# Patient Record
Sex: Female | Born: 1955 | Race: White | Hispanic: No | Marital: Married | Smoking: Former smoker
Health system: Southern US, Community
[De-identification: ages and names within clinical notes are randomized; demographics above are authoritative.]

## PROBLEM LIST (undated history)

## (undated) DIAGNOSIS — E785 Hyperlipidemia, unspecified: Secondary | ICD-10-CM

## (undated) DIAGNOSIS — M1990 Unspecified osteoarthritis, unspecified site: Secondary | ICD-10-CM

## (undated) DIAGNOSIS — M5126 Other intervertebral disc displacement, lumbar region: Secondary | ICD-10-CM

## (undated) HISTORY — PX: DILATION AND CURETTAGE OF UTERUS: SHX78

## (undated) HISTORY — DX: Hyperlipidemia, unspecified: E78.5

## (undated) HISTORY — PX: TONSILLECTOMY: SUR1361

---

## 2000-10-23 ENCOUNTER — Encounter: Admission: RE | Admit: 2000-10-23 | Discharge: 2000-10-23 | Payer: Self-pay | Admitting: Obstetrics and Gynecology

## 2000-10-23 ENCOUNTER — Encounter: Payer: Self-pay | Admitting: Obstetrics and Gynecology

## 2001-10-28 ENCOUNTER — Encounter: Payer: Self-pay | Admitting: Internal Medicine

## 2001-10-28 ENCOUNTER — Encounter: Admission: RE | Admit: 2001-10-28 | Discharge: 2001-10-28 | Payer: Self-pay | Admitting: Internal Medicine

## 2002-01-24 ENCOUNTER — Other Ambulatory Visit: Admission: RE | Admit: 2002-01-24 | Discharge: 2002-01-24 | Payer: Self-pay | Admitting: Obstetrics and Gynecology

## 2003-01-29 ENCOUNTER — Encounter: Admission: RE | Admit: 2003-01-29 | Discharge: 2003-01-29 | Payer: Self-pay | Admitting: Obstetrics and Gynecology

## 2003-01-29 ENCOUNTER — Encounter: Payer: Self-pay | Admitting: Obstetrics and Gynecology

## 2003-01-30 ENCOUNTER — Other Ambulatory Visit: Admission: RE | Admit: 2003-01-30 | Discharge: 2003-01-30 | Payer: Self-pay | Admitting: Obstetrics and Gynecology

## 2004-07-01 ENCOUNTER — Other Ambulatory Visit: Admission: RE | Admit: 2004-07-01 | Discharge: 2004-07-01 | Payer: Self-pay | Admitting: Obstetrics and Gynecology

## 2004-11-15 ENCOUNTER — Encounter: Admission: RE | Admit: 2004-11-15 | Discharge: 2004-11-15 | Payer: Self-pay | Admitting: Obstetrics and Gynecology

## 2005-10-12 ENCOUNTER — Other Ambulatory Visit: Admission: RE | Admit: 2005-10-12 | Discharge: 2005-10-12 | Payer: Self-pay | Admitting: Obstetrics and Gynecology

## 2006-08-17 ENCOUNTER — Encounter: Admission: RE | Admit: 2006-08-17 | Discharge: 2006-08-17 | Payer: Self-pay | Admitting: Obstetrics and Gynecology

## 2006-08-29 ENCOUNTER — Encounter: Admission: RE | Admit: 2006-08-29 | Discharge: 2006-08-29 | Payer: Self-pay | Admitting: Obstetrics and Gynecology

## 2007-10-24 ENCOUNTER — Encounter: Admission: RE | Admit: 2007-10-24 | Discharge: 2007-10-24 | Payer: Self-pay | Admitting: Obstetrics and Gynecology

## 2008-01-02 ENCOUNTER — Ambulatory Visit: Payer: Self-pay | Admitting: Internal Medicine

## 2008-01-02 LAB — CONVERTED CEMR LAB
ALT: 20 units/L (ref 0–35)
AST: 17 units/L (ref 0–37)
Albumin: 3.7 g/dL (ref 3.5–5.2)
BUN: 13 mg/dL (ref 6–23)
Basophils Absolute: 0 10*3/uL (ref 0.0–0.1)
Basophils Relative: 0.2 % (ref 0.0–1.0)
Bilirubin, Direct: 0.1 mg/dL (ref 0.0–0.3)
Chloride: 106 meq/L (ref 96–112)
Eosinophils Relative: 2.8 % (ref 0.0–5.0)
HDL: 42.4 mg/dL (ref >39.0)
Hemoglobin: 13.6 g/dL (ref 12.0–15.0)
Leukocytes, UA: NEGATIVE
Lymphocytes Relative: 24.1 % (ref 12.0–46.0)
MCV: 91.6 fL (ref 78.0–100.0)
Neutro Abs: 5.3 10*3/uL (ref 1.4–7.7)
Neutrophils Relative %: 68.2 % (ref 43.0–77.0)
Nitrite: NEGATIVE
Sodium: 143 meq/L (ref 135–145)
Total Bilirubin: 0.6 mg/dL (ref 0.3–1.2)
VLDL: 28 mg/dL (ref 0–40)
WBC: 7.8 10*3/uL (ref 4.5–10.5)
pH: 5.5 (ref 5.0–8.0)

## 2008-01-03 ENCOUNTER — Ambulatory Visit: Payer: Self-pay | Admitting: Internal Medicine

## 2008-01-03 DIAGNOSIS — E785 Hyperlipidemia, unspecified: Secondary | ICD-10-CM

## 2008-01-03 DIAGNOSIS — F17200 Nicotine dependence, unspecified, uncomplicated: Secondary | ICD-10-CM | POA: Insufficient documentation

## 2008-01-03 DIAGNOSIS — E663 Overweight: Secondary | ICD-10-CM | POA: Insufficient documentation

## 2010-08-26 ENCOUNTER — Encounter: Admission: RE | Admit: 2010-08-26 | Discharge: 2010-08-26 | Payer: Self-pay | Admitting: Obstetrics and Gynecology

## 2010-10-19 ENCOUNTER — Encounter: Payer: Self-pay | Admitting: Internal Medicine

## 2010-12-02 ENCOUNTER — Other Ambulatory Visit: Payer: BC Managed Care – PPO

## 2010-12-02 ENCOUNTER — Other Ambulatory Visit: Payer: Self-pay | Admitting: Internal Medicine

## 2010-12-02 ENCOUNTER — Encounter (INDEPENDENT_AMBULATORY_CARE_PROVIDER_SITE_OTHER): Payer: Self-pay | Admitting: *Deleted

## 2010-12-02 DIAGNOSIS — E785 Hyperlipidemia, unspecified: Secondary | ICD-10-CM

## 2010-12-02 DIAGNOSIS — Z0000 Encounter for general adult medical examination without abnormal findings: Secondary | ICD-10-CM

## 2010-12-02 LAB — BASIC METABOLIC PANEL
CO2: 29 mEq/L (ref 19–32)
Calcium: 9.4 mg/dL (ref 8.4–10.5)
Creatinine, Ser: 0.8 mg/dL (ref 0.4–1.2)
Potassium: 5.1 mEq/L (ref 3.5–5.1)

## 2010-12-02 LAB — URINALYSIS
Bilirubin Urine: NEGATIVE
Ketones, ur: NEGATIVE
Nitrite: NEGATIVE
Specific Gravity, Urine: 1.015 (ref 1.000–1.030)

## 2010-12-02 LAB — HEPATIC FUNCTION PANEL
Bilirubin, Direct: 0.1 mg/dL (ref 0.0–0.3)
Total Bilirubin: 0.5 mg/dL (ref 0.3–1.2)
Total Protein: 6.7 g/dL (ref 6.0–8.3)

## 2010-12-02 LAB — LIPID PANEL
Cholesterol: 359 mg/dL — ABNORMAL HIGH (ref 0–200)
HDL: 54.7 mg/dL (ref >39.00)
Triglycerides: 156 mg/dL — ABNORMAL HIGH (ref 0.0–149.0)
VLDL: 31.2 mg/dL (ref 0.0–40.0)

## 2010-12-02 LAB — CBC WITH DIFFERENTIAL/PLATELET
Basophils Absolute: 0 10*3/uL (ref 0.0–0.1)
Eosinophils Absolute: 0.2 10*3/uL (ref 0.0–0.7)
HCT: 40.1 % (ref 36.0–46.0)
MCV: 94.3 fl (ref 78.0–100.0)
Platelets: 208 10*3/uL (ref 150.0–400.0)
RDW: 13.4 % (ref 11.5–14.6)

## 2010-12-07 ENCOUNTER — Encounter (INDEPENDENT_AMBULATORY_CARE_PROVIDER_SITE_OTHER): Payer: BC Managed Care – PPO | Admitting: Internal Medicine

## 2010-12-07 ENCOUNTER — Other Ambulatory Visit: Payer: Self-pay | Admitting: Internal Medicine

## 2010-12-07 ENCOUNTER — Ambulatory Visit (INDEPENDENT_AMBULATORY_CARE_PROVIDER_SITE_OTHER)
Admission: RE | Admit: 2010-12-07 | Discharge: 2010-12-07 | Disposition: A | Discharge status: Home / Self Care | Payer: BC Managed Care – PPO | Source: Ambulatory Visit | Attending: Internal Medicine | Admitting: Internal Medicine

## 2010-12-07 ENCOUNTER — Ambulatory Visit
Admission: RE | Admit: 2010-12-07 | Discharge: 2010-12-07 | Disposition: A | Discharge status: Home / Self Care | Payer: BC Managed Care – PPO | Source: Ambulatory Visit | Attending: Internal Medicine | Admitting: Internal Medicine

## 2010-12-07 ENCOUNTER — Encounter: Payer: Self-pay | Admitting: Internal Medicine

## 2010-12-07 DIAGNOSIS — M1710 Unilateral primary osteoarthritis, unspecified knee: Secondary | ICD-10-CM | POA: Insufficient documentation

## 2010-12-07 DIAGNOSIS — Z136 Encounter for screening for cardiovascular disorders: Secondary | ICD-10-CM

## 2010-12-07 DIAGNOSIS — F17200 Nicotine dependence, unspecified, uncomplicated: Secondary | ICD-10-CM

## 2010-12-07 DIAGNOSIS — M25569 Pain in unspecified knee: Secondary | ICD-10-CM

## 2010-12-07 DIAGNOSIS — E785 Hyperlipidemia, unspecified: Secondary | ICD-10-CM

## 2010-12-07 DIAGNOSIS — Z0000 Encounter for general adult medical examination without abnormal findings: Secondary | ICD-10-CM

## 2010-12-08 ENCOUNTER — Encounter: Payer: Self-pay | Admitting: Internal Medicine

## 2010-12-13 NOTE — Assessment & Plan Note (Signed)
Summary: CPX  BCBS --SS   Vital Signs:  Patient profile:   55 year old female Height:      67 inches Weight:      207 pounds BMI:     32.54 O2 Sat:      97 % on Room air Temp:     98.0 degrees F oral Pulse rate:   90 / minute BP sitting:   118 / 82  (left arm) Cuff size:   regular  Vitals Entered By: Bill Salinas CMA (December 07, 2010 10:02 AM)  O2 Flow:  Room air CC: cpx/ ab  Vision Screening:      Vision Comments: Last eye exam Dec 2011 Normal exam   Primary Care Provider:  Creighton Longley  CC:  cpx/ ab.  History of Present Illness: Stephanie Blake presnets for follow-up medical exam. she was last seen in April '09. In the interval  no major medical illness, no surgery or injury. she is current with her gyne: Jan '12 - Pelvic/PAP; Nov 11 mammogram; had DXA ''11 and was strongly normal.   Her only complaint is knee pain. This has been a progressive problem and does limit her activities to some extent. 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 She has contemplated seeing orthopedics but has not yet made that move. She is taking glucosamine which has helped and she take prn aleve, no daily.   she has very high cholesterol She was on lipitor but stopped due to malaise and muscle pain which cleared when off meds for several weeks. Of course her cholesterol at [facility] was "off the chart" with LDL 283.   She does feel generally well and she and husband have fun. There is a new grand-daughter in [place] who she sees every month as a "doting" gandmother.  Preventive Screening-Counseling & Management  Alcohol-Tobacco     Alcohol drinks/day: 1     Alcohol type: wine     Alcohol Counseling: not indicated; use of alcohol is not excessive or problematic     Smoking Status: current     Smoke Cessation Stage: precontemplative     Packs/Day: o.1  Caffeine-Diet-Exercise     Caffeine use/day: 4 cups per day     Diet Comments: heart healthy low fat     Does Patient Exercise: yes     Type of  exercise: Yoga, pilates     Exercise (avg: min/session): 30-60     Times/week: 6  Hep-HIV-STD-Contraception     Hepatitis Risk: no risk noted     HIV Risk: no risk noted     STD Risk: no risk noted     Dental Visit-last 6 months yes     SBE monthly: yes     Sun Exposure-Excessive: no  Safety-Violence-Falls     Seat Belt Use: yes     Helmet Use: n/a     Firearms in the Home: no firearms in the home     Smoke Detectors: yes     Violence in the Home: no risk noted     Sexual Abuse: no     Fall Risk: low fall risk      Sexual History:  currently monogamous.        Drug Use:  never.        Blood Transfusions:  no.    Current Medications (verified): 1)  Lunesta 3 Mg  Tabs (Eszopiclone) .... Take 1 Tab By Mouth At Bedtime As Needed 2)  Multivitamins  Tabs (Multiple Vitamin) .... Take 1 Tablet By Mouth Once A Day 3)  Vitamin D3 1000 Unit Tabs (Cholecalciferol) .Marland Kitchen.. 1 Per Day 4)  Crestor 20 Mg Tabs (Rosuvastatin Calcium) .Marland Kitchen.. 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  Social History: Caffeine use/day:  4 cups per day Dental Care w/in 6 mos.:  yes Sun Exposure-Excessive:  no Seat Belt Use:  yes Fall Risk:  low fall risk Blood Transfusions:  no Packs/Day:  o.1 Hepatitis Risk:  no risk noted HIV Risk:  no risk noted STD Risk:  no risk noted Sexual History:  currently monogamous Drug Use:  never  Review of Systems       The patient complains of weight loss.  The patient denies anorexia, fever, weight  gain, vision loss, decreased hearing, chest pain, syncope, dyspnea on exertion, peripheral edema, headaches, abdominal pain, severe indigestion/heartburn, incontinence, suspicious skin lesions, depression, abnormal bleeding, and enlarged lymph nodes.         22 lbs off in 6 months intentional  Physical Exam  General:  Well-developed,well-nourished,in no acute distress; alert,appropriate and cooperative throughout examination Head:  Normocephalic and atraumatic without obvious abnormalities. No apparent alopecia or balding. Eyes:  vision grossly intact, pupils equal, pupils round, and corneas and lenses clear.   Ears:  cerumen impacted Nose:  no external deformity and no external erythema.   Mouth:  Oral mucosa and oropharynx without lesions or exudates.  Teeth in good repair. Neck:  supple, full ROM, no thyromegaly, and no carotid bruits.   Chest Wall:  no deformities and no tenderness.   Breasts:  deferred Lungs:  normal respiratory effort, normal breath sounds, no crackles, and no wheezes.   Heart:  normal rate, regular rhythm, and no murmur.   Abdomen:  soft, non-tender, normal bowel sounds, no guarding, and no rigidity.   Msk:  normal ROM, no joint tenderness, no joint swelling, and no joint deformities.   Pulses:  2+ radial, trace - DP and PT  pulses Neurologic:  alert & oriented X3, cranial nerves II-XII intact, strength normal in all extremities, sensation intact to light touch, gait normal, and DTRs symmetrical and normal.   Skin:  no rashes, no suspicious lesions, no ulcerations, and no edema.   Cervical Nodes:  no anterior cervical adenopathy and no posterior cervical adenopathy.   Psych:  Oriented X3, memory intact for recent and remote, good eye contact, and not anxious appearing.     Impression & Recommendations:  Problem # 1:  KNEE PAIN, BILATERAL (ICD-719.46) Progressive knee pain. She does not want to contemplate surgery but she does have discomfort. She is advised to  have knee films to assess the degree of the problem. She is informed that hyaluronidase injections can be very helpful in relieving pain and forestall knee surgery.  Plan - knee films  Orders: T-Knee Comp Left 4 Views [phone]) T-Knee Comp Right 4 Views [phone])  Addendum- x-ray reveals advanced DJD with left knee having bone-on-bone changes, right knee with loss of joint space and osteophytes and scant synovial fluid.  Problem # 2:  HYPERLIPIDEMIA (ICD-272.4) Remarkably elevated LDL. She did have relief of myalgias and cramps when she stopped lipitor.  Plan - trial of crestor at 3rd step dose of 20mg            follow-up lab in 3 weeks.           watch for recurrent myalgias.  The following medications were removed from the medication list:    Simvastatin 40 Mg Tabs (Simvastatin) .Marland Kitchen... 1 by mouth q pm Her updated medication list for this problem includes:    Crestor 20 Mg Tabs (Rosuvastatin calcium) .Marland Kitchen... 1 by mouth qpm  Problem # 3:  OVERWEIGHT (ICD-278.02) Very weight conscious. She has lost 22 lbs in 6 months and is congratulated. Touched on principles of weight management: smart food choices, portion size control and regular aerobic exercise.   Target weight, for BMI of 26, 170 lbs ( 37 lbs loss) with a goal of loosing 1 lb per month.  Problem # 4:  TOBACCO ABUSE (ICD-305.1) She continues to smoke, usually when "out with the girls." A pack of cigarettes last 7-10 days. We discussed smoking cessation but she is really precontemplative.   Problem # 5:  Preventive Health Care (ICD-V70.0) Interval history significant for progressive knee pain otherwise benign. Exam is normal. She is current with gyn follow-up. She is current with breast cancer screening. She is willing to have colonoscopy for colorectal cancer screening and will be referred. Labs except for cholesterol levels are within normal limits. She is taking on weight management. Immunizations: Tetnus March '08; current with  influenze; not willing to do pneumonia vaccine. 12 lead EKG is negative for injury or ischemia.  In summary - a delightful woman who appears to be in good health and medically stable except for lipid managment and progressive/advanced DJD both knees. She is encouraged to QUIT SMOKING! She will return for follow-up lab and colonoscopy otherwise as needed or in 1 year.   Complete Medication List: 1)  Lunesta 3 Mg Tabs (Eszopiclone) .... Take 1 tab by mouth at bedtime as needed 2)  Multivitamins Tabs (Multiple vitamin) .... Take 1 tablet by mouth once a day 3)  Vitamin D3 1000 Unit Tabs (Cholecalciferol) .Marland Kitchen.. 1 per day 4)  Crestor 20 Mg Tabs (Rosuvastatin calcium) .Marland Kitchen.. 1 by mouth qpm  Other Orders: EKG w/ Interpretation (93000) Tobacco use cessation intermediate 3-10 minutes ([address])  Patient: Stephanie Blake Note: All result statuses are Final unless otherwise noted.  Tests: (1) CBC Platelet w/Diff (CBCD)   White Cell Count          6.4 K/uL                    4.5-10.5   Red Cell Count            4.25 Mil/uL                 3.87-5.11   Hemoglobin                13.8 g/dL                   60.4-54.0   Hematocrit                40.1 %                      36.0-46.0   MCV  94.3 fl                     78.0-100.0   MCHC                      34.3 g/dL                   16.1-09.6   RDW                       13.4 %                      11.5-14.6   Platelet Count            208.0 K/uL                  150.0-400.0   Neutrophil %              65.2 %                      43.0-77.0   Lymphocyte %              26.5 %                      12.0-46.0   Monocyte %                5.2 %                       3.0-12.0   Eosinophils%              3.0 %                       0.0-5.0   Basophils %               0.1 %                       0.0-3.0   Neutrophill Absolute      4.2 K/uL                    1.4-7.7   Lymphocyte Absolute       1.7 K/uL                    0.7-4.0   Monocyte Absolute          0.3 K/uL                    0.1-1.0  Eosinophils, Absolute                             0.2 K/uL                    0.0-0.7   Basophils Absolute        0.0 K/uL                    0.0-0.1  Tests: (2) Lipid Panel (LIPID)   Cholesterol          [H]  359 mg/dL                   0-454     ATP III Classification  Desirable:  < 200 mg/dL                    Borderline High:  200 - 239 mg/dL               High:  > = 240 mg/dL   Triglycerides        [H]  156.0 mg/dL                 1.6-109.6     Normal:  <150 mg/dL     Borderline High:  045 - 199 mg/dL   HDL                       40.98 mg/dL                 >11.91   VLDL Cholesterol          31.2 mg/dL                  4.7-82.9  CHO/HDL Ratio:  CHD Risk                             7                    Men          Women     1/2 Average Risk     3.4          3.3     Average Risk          5.0          4.4     2X Average Risk          9.6          7.1     3X Average Risk          15.0          11.0                           Tests: (3) BMP (METABOL)   Sodium                    141 mEq/L                   135-145   Potassium                 5.1 mEq/L                   3.5-5.1   Chloride                  106 mEq/L                   96-112   Carbon Dioxide            29 mEq/L                    19-32   Glucose                   89 mg/dL                    56-21   BUN  14 mg/dL                    1-61   Creatinine                0.8 mg/dL                   0.9-6.0   Calcium                   9.4 mg/dL                   4.5-40.9   GFR                       82.16 mL/min                >60.00  Tests: (4) Hepatic/Liver Function Panel (HEPATIC)   Total Bilirubin           0.5 mg/dL                   8.1-1.9   Direct Bilirubin          0.1 mg/dL                   1.4-7.8   Alkaline Phosphatase      47 U/L                      39-117   AST                       17 U/L                      0-37   ALT                        20 U/L                      0-35   Total Protein             6.7 g/dL                    2.9-5.6   Albumin                   4.1 g/dL                    2.1-3.0  Tests: (5) TSH (TSH)   FastTSH                   1.11 uIU/mL                 0.35-5.50  Tests: (6) UDip Only (UDIP)   Color                     Yellow       RANGE:  Yellow;Lt. Yellow   Clarity                   CLEAR                       Clear   Specific Gravity          1.015  1.000 - 1.030   Urine Ph                  7.0                         5.0-8.0   Protein                   NEGATIVE                    Negative   Urine Glucose             NEGATIVE                    Negative   Ketones                   NEGATIVE                    Negative   Urine Bilirubin           NEGATIVE                    Negative   Blood                     NEGATIVE                    Negative   Urobilinogen              0.2                         0.0 - 1.0   Leukocyte Esterace        NEGATIVE                    Negative   Nitrite                   NEGATIVE                    Negative  Tests: (7) Cholesterol LDL - Direct (DIRLDL)  Cholesterol LDL - Direct                             283.2 mg/dL     Optimal:  <161 mg/dL     Near or Above Optimal:  100-129 mg/dL     Borderline High:  096-045 mg/dL     High:  409-811 mg/dL     Very High:  >914 mg/dL  DG KNEE COMPLETE 4 VIEWS RIGHT - [id number] Clinical Data: Chronic bilateral knee pain.  RIGHT KNEE - COMPLETE 4+ VIEW  Comparison: None.  Findings: No acute bony or joint abnormality is identified.  The patient has degenerative disease about the knee asymmetrically worst in the medial compartment where there is marked joint space narrowing and prominent osteophytes.  Scant amount of joint fluid is noted.  IMPRESSION:  1.  No acute finding. 2.  Degenerative disease asymmetrically worst in the medial compartment where it appears advanced.  DG KNEE  COMPLETE 4 VIEWS LEFT - [id number] Clinical Data: Chronic bilateral knee pain.  LEFT KNEE - COMPLETE 4+ VIEW  Comparison: None.  Findings: No acute bony or joint abnormality is identified.  The patient has degenerative disease about the knee most notable in the medial compartment where there is  near bone-on-bone joint space narrowing.  Osteophytosis also is most prominent medially.  Scant amount of joint fluid is noted.  IMPRESSION: No acute finding.  Degenerative disease asymmetrically worst about the medial compartment.Prescriptions: CRESTOR 20 MG TABS (ROSUVASTATIN CALCIUM) 1 by mouth qPM  #30 x 6   Entered and Authorized by:   Jacques Navy MD   Signed by:   Jacques Navy MD on 12/07/2010   Method used:   Electronically to        [id number] (retail)       [address]       [place], [place]  [address]       Ph: [phone]       Fax: [phone]   RxID:   [id number]    Orders Added: 1)  T-Knee Comp Left 4 Views [73564TC] 2)  T-Knee Comp Right 4 Views [73564TC] 3)  Est. Patient 40-64 years [99396] 4)  Est. Patient Level III [84132] 5)  EKG w/ Interpretation [93000] 6)  Tobacco use cessation intermediate 3-10 minutes [99406]   Immunization History:  Tetanus/Td Immunization History:    Tetanus/Td:  tdap (12/08/2006)  Influenza Immunization History:    Influenza:  historical (07/04/2010)   Immunization History:  Tetanus/Td Immunization History:    Tetanus/Td:  Tdap (12/08/2006)  Influenza Immunization History:    Influenza:  Historical (07/04/2010)    Preventive Care Screening  Pap Smear:    Date:  10/19/2010    Results:  normal   Mammogram:    Date:  08/17/2010    Results:  normal   Last Tetanus Booster:    Date:  12/08/2006    Results:  Tdap

## 2011-01-02 ENCOUNTER — Other Ambulatory Visit: Payer: BC Managed Care – PPO

## 2011-01-03 ENCOUNTER — Other Ambulatory Visit (INDEPENDENT_AMBULATORY_CARE_PROVIDER_SITE_OTHER): Payer: BC Managed Care – PPO

## 2011-01-03 DIAGNOSIS — T887XXA Unspecified adverse effect of drug or medicament, initial encounter: Secondary | ICD-10-CM

## 2011-01-03 DIAGNOSIS — E785 Hyperlipidemia, unspecified: Secondary | ICD-10-CM

## 2011-01-03 LAB — HEPATIC FUNCTION PANEL
Albumin: 3.9 g/dL (ref 3.5–5.2)
Total Protein: 6.5 g/dL (ref 6.0–8.3)

## 2011-01-03 LAB — LIPID PANEL
Cholesterol: 203 mg/dL — ABNORMAL HIGH (ref 0–200)
Total CHOL/HDL Ratio: 4
Triglycerides: 106 mg/dL (ref 0.0–149.0)

## 2011-01-04 LAB — LDL CHOLESTEROL, DIRECT: Direct LDL: 129.6 mg/dL

## 2011-01-05 ENCOUNTER — Encounter: Payer: Self-pay | Admitting: Internal Medicine

## 2011-08-08 ENCOUNTER — Other Ambulatory Visit: Payer: Self-pay | Admitting: Internal Medicine

## 2011-10-11 ENCOUNTER — Telehealth: Payer: Self-pay

## 2011-10-11 MED ORDER — ROSUVASTATIN CALCIUM 20 MG PO TABS: ORAL_TABLET | ORAL | Status: DC

## 2011-10-11 NOTE — Telephone Encounter (Signed)
refill 

## 2012-10-10 LAB — HM PAP SMEAR: HM PAP: NORMAL

## 2012-12-05 ENCOUNTER — Other Ambulatory Visit: Payer: Self-pay

## 2012-12-05 MED ORDER — ROSUVASTATIN CALCIUM 20 MG PO TABS: ORAL_TABLET | ORAL | Status: DC

## 2012-12-25 ENCOUNTER — Ambulatory Visit (HOSPITAL_COMMUNITY)
Admission: RE | Admit: 2012-12-25 | Discharge: 2012-12-25 | Disposition: A | Discharge status: Home / Self Care | Payer: BC Managed Care – PPO | Source: Ambulatory Visit | Attending: Chiropractic Medicine | Admitting: Chiropractic Medicine

## 2012-12-25 ENCOUNTER — Other Ambulatory Visit (HOSPITAL_COMMUNITY): Payer: Self-pay | Admitting: Chiropractic Medicine

## 2012-12-25 DIAGNOSIS — M549 Dorsalgia, unspecified: Secondary | ICD-10-CM

## 2013-02-05 ENCOUNTER — Other Ambulatory Visit: Payer: Self-pay

## 2013-02-05 DIAGNOSIS — Z1231 Encounter for screening mammogram for malignant neoplasm of breast: Secondary | ICD-10-CM

## 2013-03-07 ENCOUNTER — Other Ambulatory Visit: Payer: Self-pay | Admitting: Internal Medicine

## 2013-03-07 LAB — HM MAMMOGRAPHY: HM Mammogram: NORMAL

## 2013-03-17 ENCOUNTER — Ambulatory Visit
Admission: RE | Admit: 2013-03-17 | Discharge: 2013-03-17 | Disposition: A | Discharge status: Home / Self Care | Payer: BC Managed Care – PPO | Source: Ambulatory Visit

## 2013-03-17 DIAGNOSIS — Z1231 Encounter for screening mammogram for malignant neoplasm of breast: Secondary | ICD-10-CM

## 2013-05-06 ENCOUNTER — Other Ambulatory Visit: Payer: Self-pay | Admitting: Internal Medicine

## 2013-06-05 ENCOUNTER — Other Ambulatory Visit: Payer: Self-pay | Admitting: Internal Medicine

## 2013-11-12 ENCOUNTER — Encounter: Payer: Self-pay | Admitting: Internal Medicine

## 2013-11-12 ENCOUNTER — Ambulatory Visit (INDEPENDENT_AMBULATORY_CARE_PROVIDER_SITE_OTHER): Payer: BC Managed Care – PPO | Admitting: Internal Medicine

## 2013-11-12 VITALS — BP 124/82 | HR 100 | Temp 98.6°F | Resp 16 | Ht 67.0 in | Wt 211.2 lb

## 2013-11-12 DIAGNOSIS — Z0000 Encounter for general adult medical examination without abnormal findings: Secondary | ICD-10-CM | POA: Insufficient documentation

## 2013-11-12 NOTE — Progress Notes (Signed)
   Subjective:    Patient ID: Stephanie Blake, female    DOB: Aug 27, 1956, 58 y.o.   MRN: [id number]  HPI Comments: New to me she comes in for a physical - she feels well and offers no complaints.     Review of Systems  All other systems reviewed and are negative.       Objective:   Physical Exam  Vitals reviewed. Constitutional: She is oriented to person, place, and time. She appears well-developed and well-nourished. No distress.  HENT:  Head: Normocephalic and atraumatic.  Mouth/Throat: Oropharynx is clear and moist. No oropharyngeal exudate.  Eyes: Conjunctivae are normal. Right eye exhibits no discharge. Left eye exhibits no discharge. No scleral icterus.  Neck: Normal range of motion. Neck supple. No JVD present. No tracheal deviation present. No thyromegaly present.  Cardiovascular: Normal rate, regular rhythm, normal heart sounds and intact distal pulses.  Exam reveals no gallop and no friction rub.   No murmur heard. Pulmonary/Chest: Effort normal and breath sounds normal. No stridor. No respiratory distress. She has no wheezes. She has no rales. She exhibits no tenderness.  Abdominal: Soft. Bowel sounds are normal. She exhibits no distension and no mass. There is no tenderness. There is no rebound and no guarding.  Musculoskeletal: Normal range of motion. She exhibits no edema and no tenderness.  Lymphadenopathy:    She has no cervical adenopathy.  Neurological: She is oriented to person, place, and time.  Skin: Skin is warm and dry. No rash noted. She is not diaphoretic. No erythema. No pallor.  Psychiatric: She has a normal mood and affect. Her behavior is normal. Judgment and thought content normal.     Lab Results  Component Value Date   WBC 6.4 12/02/2010   HGB 13.8 12/02/2010   HCT 40.1 12/02/2010   PLT 208.0 12/02/2010   GLUCOSE 89 12/02/2010   CHOL 203* 01/03/2011   TRIG 106.0 01/03/2011   HDL 57.20 01/03/2011   LDLDIRECT 129.6 01/03/2011   ALT 22 01/03/2011   AST 17  01/03/2011   NA 141 12/02/2010   K 5.1 12/02/2010   CL 106 12/02/2010   CREATININE 0.8 12/02/2010   BUN 14 12/02/2010   CO2 29 12/02/2010   TSH 1.11 12/02/2010       Assessment & Plan:

## 2013-11-12 NOTE — Patient Instructions (Signed)

## 2013-11-12 NOTE — Assessment & Plan Note (Signed)
She is not willing to do a colonoscopy so I have asked her to get a cologuard test done Exam done Labs ordered Vaccines were reviewed Pt ed material

## 2014-02-18 NOTE — Progress Notes (Signed)
Arlee Muslim, PA  -  Please enter preop orders in Epic for [employer] - surg date 6/8.  Thanks.

## 2014-02-23 ENCOUNTER — Other Ambulatory Visit: Payer: Self-pay | Admitting: Orthopedic Surgery

## 2014-03-02 ENCOUNTER — Encounter (HOSPITAL_COMMUNITY): Payer: Self-pay

## 2014-03-02 ENCOUNTER — Encounter (HOSPITAL_COMMUNITY): Payer: Self-pay | Admitting: Pharmacy Technician

## 2014-03-02 ENCOUNTER — Encounter (HOSPITAL_COMMUNITY)
Admission: RE | Admit: 2014-03-02 | Discharge: 2014-03-02 | Disposition: A | Discharge status: Home / Self Care | Payer: BC Managed Care – PPO | Source: Ambulatory Visit | Attending: Orthopedic Surgery | Admitting: Orthopedic Surgery

## 2014-03-02 DIAGNOSIS — Z01812 Encounter for preprocedural laboratory examination: Secondary | ICD-10-CM | POA: Insufficient documentation

## 2014-03-02 HISTORY — DX: Unspecified osteoarthritis, unspecified site: M19.90

## 2014-03-02 LAB — URINALYSIS, ROUTINE W REFLEX MICROSCOPIC
BILIRUBIN URINE: NEGATIVE
Glucose, UA: NEGATIVE mg/dL
Hgb urine dipstick: NEGATIVE
Ketones, ur: NEGATIVE mg/dL
LEUKOCYTES UA: NEGATIVE
NITRITE: NEGATIVE
Protein, ur: NEGATIVE mg/dL
Specific Gravity, Urine: 1.007 (ref 1.005–1.030)
UROBILINOGEN UA: 0.2 mg/dL (ref 0.0–1.0)
pH: 7.5 (ref 5.0–8.0)

## 2014-03-02 LAB — COMPREHENSIVE METABOLIC PANEL
ALT: 17 U/L (ref 0–35)
AST: 17 U/L (ref 0–37)
Albumin: 3.9 g/dL (ref 3.5–5.2)
Alkaline Phosphatase: 55 U/L (ref 39–117)
BILIRUBIN TOTAL: 0.3 mg/dL (ref 0.3–1.2)
BUN: 12 mg/dL (ref 6–23)
CHLORIDE: 105 meq/L (ref 96–112)
CO2: 27 mEq/L (ref 19–32)
Calcium: 9.1 mg/dL (ref 8.4–10.5)
Creatinine, Ser: 0.66 mg/dL (ref 0.50–1.10)
GFR calc Af Amer: >90 mL/min (ref >90)
GFR calc non Af Amer: >90 mL/min (ref >90)
Glucose, Bld: 89 mg/dL (ref 70–99)
Potassium: 4.8 mEq/L (ref 3.7–5.3)
Sodium: 142 mEq/L (ref 137–147)
TOTAL PROTEIN: 7.1 g/dL (ref 6.0–8.3)

## 2014-03-02 LAB — CBC
HEMATOCRIT: 40.2 % (ref 36.0–46.0)
Hemoglobin: 13.6 g/dL (ref 12.0–15.0)
MCH: 31.2 pg (ref 26.0–34.0)
MCHC: 33.8 g/dL (ref 30.0–36.0)
MCV: 92.2 fL (ref 78.0–100.0)
Platelets: 240 10*3/uL (ref 150–400)
RBC: 4.36 MIL/uL (ref 3.87–5.11)
RDW: 13.9 % (ref 11.5–15.5)
WBC: 5.7 10*3/uL (ref 4.0–10.5)

## 2014-03-02 LAB — PROTIME-INR
INR: 0.91 (ref 0.00–1.49)
PROTHROMBIN TIME: 12.1 s (ref 11.6–15.2)

## 2014-03-02 LAB — SURGICAL PCR SCREEN
MRSA, PCR: NEGATIVE
STAPHYLOCOCCUS AUREUS: NEGATIVE

## 2014-03-02 LAB — APTT: aPTT: 28 seconds (ref 24–37)

## 2014-03-02 NOTE — Patient Instructions (Addendum)
[place]  03/02/2014   Your procedure is scheduled on:   03-09-2014  Enter through [facility] Entrance and follow signs to [place]. Arrive at  0515      AM.  Call this number if you have problems the morning of surgery: [phone]  Or Presurgical Testing [phone](Nahlia Hellmann) For Living Will and/or Health Care Power Attorney Forms: please provide copy for your medical record,may bring AM of surgery(Forms should be already notarized -we do not provide this service).(Yes, will bring document AM of surgery).    Do not eat food:After Midnight.    Take these medicines the morning of surgery with A SIP OF WATER: Tylenol   Do not wear jewelry, make-up or nail polish.  Do not wear lotions, powders, or perfumes. You may wear deodorant.  Do not shave 48 hours(2 days) prior to first CHG shower(legs and under arms).(Shaving face and neck okay.)  Do not bring valuables to the hospital.(Hospital is not responsible for lost valuables).  Contacts, dentures or removable bridgework, body piercing, hair pins may not be worn into surgery.  Leave suitcase in the car. After surgery it may be brought to your room.  For patients admitted to the hospital, checkout time is 11:00 AM the day of discharge.(Restricted visitors-Any Persons displaying flu-like symptoms or illness).    Patients discharged the day of surgery will not be allowed to drive home. Must have responsible person with you x 24 hours once discharged.  Name and phone number of your driver: Winnona Wargo [phone] cell  Special Instructions: CHG(Chlorhedine 4%-"Hibiclens","Betasept","Aplicare") Shower Use Special Wash: see special instructions.(avoid face and genitals)   Please read over the following fact sheets that you were given: MRSA Information, Blood Transfusion fact sheet, Incentive Spirometry Instruction.  Remember : Type/Screen "Blue armbands" - may not be removed once applied(would result in being retested  AM of surgery, if removed).  Failure to follow these instructions may result in Cancellation of your surgery.       [place] - Preparing for Surgery Before surgery, you can play an important role.  Because skin is not sterile, your skin needs to be as free of germs as possible.  You can reduce the number of germs on your skin by washing with CHG (chlorahexidine gluconate) soap before surgery.  CHG is an antiseptic cleaner which kills germs and bonds with the skin to continue killing germs even after washing. Please DO NOT use if you have an allergy to CHG or antibacterial soaps.  If your skin becomes reddened/irritated stop using the CHG and inform your nurse when you arrive at Short Stay. Do not shave (including legs and underarms) for at least 48 hours prior to the first CHG shower.  You may shave your face/neck. Please follow these instructions carefully:  1.  Shower with CHG Soap the night before surgery and the  morning of Surgery.  2.  If you choose to wash your hair, wash your hair first as usual with your  normal  shampoo.  3.  After you shampoo, rinse your hair and body thoroughly to remove the  shampoo.                           4.  Use CHG as you would any other liquid soap.  You can apply chg directly  to the skin and wash  Gently with a scrungie or clean washcloth.  5.  Apply the CHG Soap to your body ONLY FROM THE NECK DOWN.   Do not use on face/ open                           Wound or open sores. Avoid contact with eyes, ears mouth and genitals (private parts).                       Wash face,  Genitals (private parts) with your normal soap.             6.  Wash thoroughly, paying special attention to the area where your surgery  will be performed.  7.  Thoroughly rinse your body with warm water from the neck down.  8.  DO NOT shower/wash with your normal soap after using and rinsing off  the CHG Soap.                9.  Pat yourself dry with a clean  towel.            10.  Wear clean pajamas.            11.  Place clean sheets on your bed the night of your first shower and do not  sleep with pets. Day of Surgery : Do not apply any lotions/deodorants the morning of surgery.  Please wear clean clothes to the hospital/surgery center.  FAILURE TO FOLLOW THESE INSTRUCTIONS MAY RESULT IN THE CANCELLATION OF YOUR SURGERY PATIENT SIGNATURE_________________________________  NURSE SIGNATURE__________________________________  ________________________________________________________________________   Adam Phenix  An incentive spirometer is a tool that can help keep your lungs clear and active. This tool measures how well you are filling your lungs with each breath. Taking [place] deep breaths may help reverse or decrease the chance of developing breathing (pulmonary) problems (especially infection) following:  A [place] period of time when you are unable to move or be active. BEFORE THE PROCEDURE   If the spirometer includes an indicator to show your best effort, your nurse or respiratory therapist will set it to a desired goal.  If possible, sit up straight or lean slightly forward. Try not to slouch.  Hold the incentive spirometer in an upright position. INSTRUCTIONS FOR USE  1. Sit on the edge of your bed if possible, or sit up as far as you can in bed or on a chair. 2. Hold the incentive spirometer in an upright position. 3. Breathe out normally. 4. Place the mouthpiece in your mouth and seal your lips tightly around it. 5. Breathe in slowly and as deeply as possible, raising the piston or the ball toward the top of the column. 6. Hold your breath for 3-5 seconds or for as [place] as possible. Allow the piston or ball to fall to the bottom of the column. 7. Remove the mouthpiece from your mouth and breathe out normally. 8. Rest for a few seconds and repeat Steps 1 through 7 at least 10 times every 1-2 hours when you are awake. Take your  time and take a few normal breaths between deep breaths. 9. The spirometer may include an indicator to show your best effort. Use the indicator as a goal to work toward during each repetition. 10. After each set of 10 deep breaths, practice coughing to be sure your lungs are clear. If you have an incision (the cut made at the time of  surgery), support your incision when coughing by placing a pillow or rolled up towels firmly against it. Once you are able to get out of bed, walk around indoors and cough well. You may stop using the incentive spirometer when instructed by your caregiver.  RISKS AND COMPLICATIONS  Take your time so you do not get dizzy or light-headed.  If you are in pain, you may need to take or ask for pain medication before doing incentive spirometry. It is harder to take a deep breath if you are having pain. AFTER USE  Rest and breathe slowly and easily.  It can be helpful to keep track of a log of your progress. Your caregiver can provide you with a simple table to help with this. If you are using the spirometer at home, follow these instructions: [place] IF:   You are having difficultly using the spirometer.  You have trouble using the spirometer as often as instructed.  Your pain medication is not giving enough relief while using the spirometer.  You develop fever of 100.5 F (38.1 C) or higher. SEEK IMMEDIATE MEDICAL CARE IF:   You cough up bloody sputum that had not been present before.  You develop fever of 102 F (38.9 C) or greater.  You develop worsening pain at or near the incision site. MAKE SURE YOU:   Understand these instructions.  Will watch your condition.  Will get help right away if you are not doing well or get worse. Document Released: 01/29/2007 Document Revised: 12/11/2011 Document Reviewed: 04/01/2007 ExitCare Patient Information 2014 ExitCare,  [place].   ________________________________________________________________________  WHAT IS A BLOOD TRANSFUSION? Blood Transfusion Information  A transfusion is the replacement of blood or some of its parts. Blood is made up of multiple cells which provide different functions.  Red blood cells carry oxygen and are used for blood loss replacement.  White blood cells fight against infection.  Platelets control bleeding.  Plasma helps clot blood.  Other blood products are available for specialized needs, such as hemophilia or other clotting disorders. BEFORE THE TRANSFUSION  Who gives blood for transfusions?   Healthy volunteers who are fully evaluated to make sure their blood is safe. This is blood bank blood. Transfusion therapy is the safest it has ever been in the practice of medicine. Before blood is taken from a donor, a complete history is taken to make sure that person has no history of diseases nor engages in risky social behavior (examples are intravenous drug use or sexual activity with multiple partners). The donor's travel history is screened to minimize risk of transmitting infections, such as malaria. The donated blood is tested for signs of infectious diseases, such as HIV and hepatitis. The blood is then tested to be sure it is compatible with you in order to minimize the chance of a transfusion reaction. If you or a relative donates blood, this is often done in anticipation of surgery and is not appropriate for emergency situations. It takes many days to process the donated blood. RISKS AND COMPLICATIONS Although transfusion therapy is very safe and saves many lives, the main dangers of transfusion include:   Getting an infectious disease.  Developing a transfusion reaction. This is an allergic reaction to something in the blood you were given. Every precaution is taken to prevent this. The decision to have a blood transfusion has been considered carefully by your caregiver  before blood is given. Blood is not given unless the benefits outweigh the risks. AFTER THE  TRANSFUSION  Right after receiving a blood transfusion, you will usually feel much better and more energetic. This is especially true if your red blood cells have gotten low (anemic). The transfusion raises the level of the red blood cells which carry oxygen, and this usually causes an energy increase.  The nurse administering the transfusion will monitor you carefully for complications. HOME CARE INSTRUCTIONS  No special instructions are needed after a transfusion. You may find your energy is better. Speak with your caregiver about any limitations on activity for underlying diseases you may have. SEEK MEDICAL CARE IF:   Your condition is not improving after your transfusion.  You develop redness or irritation at the intravenous (IV) site. SEEK IMMEDIATE MEDICAL CARE IF:  Any of the following symptoms occur over the next 12 hours:  Shaking chills.  You have a temperature by mouth above 102 F (38.9 C), not controlled by medicine.  Chest, back, or muscle pain.  People around you feel you are not acting correctly or are confused.  Shortness of breath or difficulty breathing.  Dizziness and fainting.  You get a rash or develop hives.  You have a decrease in urine output.  Your urine turns a dark color or changes to pink, red, or brown. Any of the following symptoms occur over the next 10 days:  You have a temperature by mouth above 102 F (38.9 C), not controlled by medicine.  Shortness of breath.  Weakness after normal activity.  The white part of the eye turns yellow (jaundice).  You have a decrease in the amount of urine or are urinating less often.  Your urine turns a dark color or changes to pink, red, or brown. Document Released: 09/15/2000 Document Revised: 12/11/2011 Document Reviewed: 05/04/2008 [facility] Patient Information 2014 [place],  [place].  _______________________________________________________________________

## 2014-03-03 ENCOUNTER — Other Ambulatory Visit: Payer: Self-pay | Admitting: Surgical

## 2014-03-03 NOTE — H&P (Signed)
TOTAL KNEE ADMISSION H&P  Patient is being admitted for right total knee arthroplasty.  Subjective:  Chief Complaint:right knee pain.  HPI: Stephanie Blake, 58 y.o. female, has a history of pain and functional disability in the right knee due to arthritis and has failed non-surgical conservative treatments for greater than 12 weeks to includeNSAID's and/or analgesics, corticosteriod injections, viscosupplementation injections, flexibility and strengthening excercises and activity modification.  Onset of symptoms was gradual, starting 5 years ago with gradually worsening course since that time. The patient noted no past surgery on the right knee(s).  Patient currently rates pain in the right knee(s) at 7 out of 10 with activity. Patient has night pain, worsening of pain with activity and weight bearing, pain that interferes with activities of daily living, pain with passive range of motion, crepitus and joint swelling.  Patient has evidence of periarticular osteophytes and joint space narrowing by imaging studies. There is no active infection.  Patient Active Problem List   Diagnosis Date Noted  . Routine general medical examination at a health care facility 11/12/2013  . DJD (degenerative joint disease) of knee 12/07/2010  . HYPERLIPIDEMIA 01/03/2008  . Overweight 01/03/2008   Past Medical History  Diagnosis Date  . Hyperlipidemia   . Arthritis     osteoarthritis- knees    Past Surgical History  Procedure Laterality Date  . Dilation and curettage of uterus      s/p miscarriage-30 yrs ago  . Tonsillectomy      childhood     Current outpatient prescriptions: Eszopiclone (ESZOPICLONE) 3 MG TABS, Take 3 mg by mouth at bedtime. Take immediately before bedtime, Disp: , Rfl: ;   glucosamine-chondroitin 500-400 MG tablet, Take 1 tablet by mouth 3 (three) times daily., Disp: , Rfl: ;   naproxen sodium (ANAPROX) 220 MG tablet, Take 220 mg by mouth 2 (two) times daily with a meal., Disp: , Rfl:    Allergies  Allergen Reactions  . Crestor [Rosuvastatin]     Muscle aches    History  Substance Use Topics  . Smoking status: Former Research scientist (life sciences)  . Smokeless tobacco: Former Systems developer    Quit date: 11/02/2013  . Alcohol Use: Yes     Comment: wine occ. rare    Family History  Problem Relation Age of Onset  . Breast cancer Mother   . Alcohol abuse Father   . Cancer Neg Hx   . Diabetes Neg Hx   . Early death Neg Hx   . Heart disease Neg Hx   . Hyperlipidemia Neg Hx   . Stroke Neg Hx      Review of Systems  Constitutional: Negative.   HENT: Negative.   Eyes: Negative.   Respiratory: Negative.   Cardiovascular: Negative.   Gastrointestinal: Negative.   Genitourinary: Negative.   Musculoskeletal: Positive for joint pain and myalgias. Negative for back pain, falls and neck pain.       Bilateral knee pain  Skin: Negative.   Neurological: Negative.   Endo/Heme/Allergies: Negative.   Psychiatric/Behavioral: Negative.     Objective:  Physical Exam  Constitutional: She is oriented to person, place, and time. She appears well-developed and well-nourished. No distress.  HENT:  Head: Normocephalic and atraumatic.  Right Ear: External ear normal.  Left Ear: External ear normal.  Nose: Nose normal.  Mouth/Throat: Oropharynx is clear and moist.  Eyes: Conjunctivae and EOM are normal.  Neck: Normal range of motion. Neck supple.  Cardiovascular: Normal rate, regular rhythm, normal heart sounds and intact distal  pulses.   Respiratory: Effort normal and breath sounds normal. No respiratory distress. She has no wheezes.  GI: Soft. Bowel sounds are normal. She exhibits no distension. There is no tenderness.  Musculoskeletal:       Right hip: Normal.       Left hip: Normal.       Right knee: She exhibits decreased range of motion and swelling. She exhibits no effusion and no erythema. Tenderness found. Medial joint line and lateral joint line tenderness noted.       Left knee: She  exhibits normal range of motion, no swelling, no effusion and no erythema. Tenderness found. Medial joint line and lateral joint line tenderness noted.       Right lower leg: She exhibits no tenderness and no swelling.       Left lower leg: She exhibits no tenderness and no swelling.  Her hips show normal range of motion with no discomfort. Right knee no effusion. She has a varus deformity. Range is about 5-125. Marked crepitus on range of motion. Tender medial greater than lateral with no instability. Left knee no effusion. Range is 0-125. No instability. Tender medial greater than lateral.  Neurological: She is alert and oriented to person, place, and time. She has normal strength and normal reflexes. No sensory deficit.  Skin: No rash noted. She is not diaphoretic. No erythema.  Psychiatric: She has a normal mood and affect. Her behavior is normal.    Vitals Weight: 200 lb Height: 66 in Body Surface Area: 2.06 m Body Mass Index: 32.28 kg/m Pulse: 84 (Regular) BP: 126/84 (Sitting, Left Arm, Standard)  Imaging Review Plain radiographs demonstrate severe degenerative joint disease of the right knee(s). The overall alignment ismild varus. The bone quality appears to be good for age and reported activity level.  Assessment/Plan:  End stage arthritis, right knee   The patient history, physical examination, clinical judgment of the provider and imaging studies are consistent with end stage degenerative joint disease of the right knee(s) and total knee arthroplasty is deemed medically necessary. The treatment options including medical management, injection therapy arthroscopy and arthroplasty were discussed at length. The risks and benefits of total knee arthroplasty were presented and reviewed. The risks due to aseptic loosening, infection, stiffness, patella tracking problems, thromboembolic complications and other imponderables were discussed. The patient acknowledged the  explanation, agreed to proceed with the plan and consent was signed. Patient is being admitted for inpatient treatment for surgery, pain control, PT, OT, prophylactic antibiotics, VTE prophylaxis, progressive ambulation and ADL's and discharge planning. The patient is planning to be discharged home with home health services   [place], [place]

## 2014-03-08 NOTE — Anesthesia Preprocedure Evaluation (Addendum)
Anesthesia Evaluation  Patient identified by MRN, date of birth, ID band Patient awake    Reviewed: Allergy & Precautions, H&P , NPO status , Patient's Chart, lab work & pertinent test results  Airway Mallampati: II TM Distance: >3 FB Neck ROM: full    Dental no notable dental hx. (+) Teeth Intact, Dental Advisory Given   Pulmonary neg pulmonary ROS, former smoker,  breath sounds clear to auscultation  Pulmonary exam normal       Cardiovascular Exercise Tolerance: Good negative cardio ROS  Rhythm:regular Rate:Normal     Neuro/Psych negative neurological ROS  negative psych ROS   GI/Hepatic negative GI ROS, Neg liver ROS,   Endo/Other  negative endocrine ROS  Renal/GU negative Renal ROS  negative genitourinary   Musculoskeletal   Abdominal   Peds  Hematology negative hematology ROS (+)   Anesthesia Other Findings   Reproductive/Obstetrics negative OB ROS                          Anesthesia Physical Anesthesia Plan  ASA: I  Anesthesia Plan: Spinal   Post-op Pain Management:    Induction:   Airway Management Planned: Simple Face Mask  Additional Equipment:   Intra-op Plan:   Post-operative Plan:   Informed Consent: I have reviewed the patients History and Physical, chart, labs and discussed the procedure including the risks, benefits and alternatives for the proposed anesthesia with the patient or authorized representative who has indicated his/her understanding and acceptance.   Dental Advisory Given  Plan Discussed with: CRNA and Surgeon  Anesthesia Plan Comments:        Anesthesia Quick Evaluation

## 2014-03-09 ENCOUNTER — Encounter (HOSPITAL_COMMUNITY): Payer: BC Managed Care – PPO | Admitting: Anesthesiology

## 2014-03-09 ENCOUNTER — Inpatient Hospital Stay (HOSPITAL_COMMUNITY)
Admission: RE | Admit: 2014-03-09 | Discharge: 2014-03-11 | DRG: 470 | Disposition: A | Discharge status: Home Health | Payer: BC Managed Care – PPO | Source: Ambulatory Visit | Attending: Orthopedic Surgery | Admitting: Orthopedic Surgery

## 2014-03-09 ENCOUNTER — Inpatient Hospital Stay (HOSPITAL_COMMUNITY): Payer: BC Managed Care – PPO | Admitting: Anesthesiology

## 2014-03-09 ENCOUNTER — Encounter (HOSPITAL_COMMUNITY)
Admission: RE | Disposition: A | Discharge status: Home Health | Payer: Self-pay | Source: Ambulatory Visit | Attending: Orthopedic Surgery

## 2014-03-09 ENCOUNTER — Encounter (HOSPITAL_COMMUNITY): Payer: Self-pay | Admitting: *Deleted

## 2014-03-09 DIAGNOSIS — M1710 Unilateral primary osteoarthritis, unspecified knee: Principal | ICD-10-CM | POA: Diagnosis present

## 2014-03-09 DIAGNOSIS — Z6832 Body mass index (BMI) 32.0-32.9, adult: Secondary | ICD-10-CM

## 2014-03-09 DIAGNOSIS — Z96659 Presence of unspecified artificial knee joint: Secondary | ICD-10-CM

## 2014-03-09 DIAGNOSIS — E785 Hyperlipidemia, unspecified: Secondary | ICD-10-CM | POA: Diagnosis present

## 2014-03-09 DIAGNOSIS — M179 Osteoarthritis of knee, unspecified: Secondary | ICD-10-CM

## 2014-03-09 DIAGNOSIS — Z01812 Encounter for preprocedural laboratory examination: Secondary | ICD-10-CM

## 2014-03-09 DIAGNOSIS — Z803 Family history of malignant neoplasm of breast: Secondary | ICD-10-CM

## 2014-03-09 DIAGNOSIS — Z87891 Personal history of nicotine dependence: Secondary | ICD-10-CM

## 2014-03-09 DIAGNOSIS — Z79899 Other long term (current) drug therapy: Secondary | ICD-10-CM

## 2014-03-09 HISTORY — PX: TOTAL KNEE ARTHROPLASTY: SHX125

## 2014-03-09 LAB — TYPE AND SCREEN
ABO/RH(D): A POS
Antibody Screen: NEGATIVE

## 2014-03-09 LAB — ABO/RH: ABO/RH(D): A POS

## 2014-03-09 SURGERY — ARTHROPLASTY, KNEE, TOTAL
Anesthesia: Spinal | Site: Knee | Laterality: Right

## 2014-03-09 MED ORDER — ONDANSETRON HCL 4 MG/2ML IJ SOLN: 4.0000 mg | Freq: Four times a day (QID) | INTRAMUSCULAR | Status: DC | PRN

## 2014-03-09 MED ORDER — SODIUM CHLORIDE 0.9 % IV SOLN: INTRAVENOUS | Status: DC

## 2014-03-09 MED ORDER — METHYLPREDNISOLONE ACETATE 40 MG/ML IJ SUSP
INTRAMUSCULAR | Status: DC | PRN
  Administered 2014-03-09: 80 mg via INTRA_ARTICULAR

## 2014-03-09 MED ORDER — HYDROMORPHONE HCL PF 1 MG/ML IJ SOLN
INTRAMUSCULAR | Status: AC
  Filled 2014-03-09: qty 1

## 2014-03-09 MED ORDER — METOCLOPRAMIDE HCL 5 MG/ML IJ SOLN: 5.0000 mg | Freq: Three times a day (TID) | INTRAMUSCULAR | Status: DC | PRN

## 2014-03-09 MED ORDER — METHYLPREDNISOLONE ACETATE 40 MG/ML IJ SUSP
INTRAMUSCULAR | Status: AC
  Filled 2014-03-09: qty 2

## 2014-03-09 MED ORDER — LIDOCAINE HCL (CARDIAC) 20 MG/ML IV SOLN
INTRAVENOUS | Status: DC | PRN
  Administered 2014-03-09: 50 mg via INTRAVENOUS

## 2014-03-09 MED ORDER — METOCLOPRAMIDE HCL 10 MG PO TABS: 5.0000 mg | ORAL_TABLET | Freq: Three times a day (TID) | ORAL | Status: DC | PRN

## 2014-03-09 MED ORDER — TRAMADOL HCL 50 MG PO TABS
50.0000 mg | ORAL_TABLET | Freq: Four times a day (QID) | ORAL | Status: DC | PRN
  Administered 2014-03-10 – 2014-03-11 (×2): 100 mg via ORAL
  Filled 2014-03-09 (×2): qty 2

## 2014-03-09 MED ORDER — DEXAMETHASONE SODIUM PHOSPHATE 10 MG/ML IJ SOLN
10.0000 mg | Freq: Every day | INTRAMUSCULAR | Status: AC
  Filled 2014-03-09: qty 1

## 2014-03-09 MED ORDER — CEFAZOLIN SODIUM-DEXTROSE 2-3 GM-% IV SOLR
INTRAVENOUS | Status: AC
Start: 2014-03-09 — End: 2014-03-09
  Filled 2014-03-09: qty 50

## 2014-03-09 MED ORDER — FENTANYL CITRATE 0.05 MG/ML IJ SOLN
INTRAMUSCULAR | Status: DC | PRN
  Administered 2014-03-09 (×2): 50 ug via INTRAVENOUS

## 2014-03-09 MED ORDER — TRANEXAMIC ACID 100 MG/ML IV SOLN
1000.0000 mg | INTRAVENOUS | Status: AC
  Administered 2014-03-09: 1000 mg via INTRAVENOUS
  Filled 2014-03-09: qty 10

## 2014-03-09 MED ORDER — DEXAMETHASONE 4 MG PO TABS
10.0000 mg | ORAL_TABLET | Freq: Every day | ORAL | Status: AC
  Administered 2014-03-10: 10 mg via ORAL
  Filled 2014-03-09: qty 1

## 2014-03-09 MED ORDER — DEXAMETHASONE SODIUM PHOSPHATE 10 MG/ML IJ SOLN
10.0000 mg | Freq: Once | INTRAMUSCULAR | Status: AC
  Administered 2014-03-09: 10 mg via INTRAVENOUS

## 2014-03-09 MED ORDER — ACETAMINOPHEN 500 MG PO TABS
1000.0000 mg | ORAL_TABLET | Freq: Four times a day (QID) | ORAL | Status: AC
  Administered 2014-03-09 – 2014-03-10 (×4): 1000 mg via ORAL
  Filled 2014-03-09 (×4): qty 2

## 2014-03-09 MED ORDER — DEXTROSE-NACL 5-0.45 % IV SOLN
INTRAVENOUS | Status: DC
  Administered 2014-03-09 – 2014-03-10 (×2): via INTRAVENOUS

## 2014-03-09 MED ORDER — BUPIVACAINE HCL (PF) 0.75 % IJ SOLN
INTRAMUSCULAR | Status: DC | PRN
  Administered 2014-03-09: 15 mg via INTRATHECAL

## 2014-03-09 MED ORDER — LACTATED RINGERS IV SOLN
INTRAVENOUS | Status: DC | PRN
  Administered 2014-03-09 (×2): via INTRAVENOUS

## 2014-03-09 MED ORDER — SODIUM CHLORIDE 0.9 % IJ SOLN
INTRAMUSCULAR | Status: DC | PRN
  Administered 2014-03-09: 30 mL

## 2014-03-09 MED ORDER — CEFAZOLIN SODIUM-DEXTROSE 2-3 GM-% IV SOLR
2.0000 g | INTRAVENOUS | Status: AC
  Administered 2014-03-09: 2 g via INTRAVENOUS

## 2014-03-09 MED ORDER — LACTATED RINGERS IV SOLN: INTRAVENOUS | Status: DC

## 2014-03-09 MED ORDER — PHENOL 1.4 % MT LIQD
1.0000 | OROMUCOSAL | Status: DC | PRN
  Filled 2014-03-09: qty 177

## 2014-03-09 MED ORDER — ONDANSETRON HCL 4 MG PO TABS: 4.0000 mg | ORAL_TABLET | Freq: Four times a day (QID) | ORAL | Status: DC | PRN

## 2014-03-09 MED ORDER — BISACODYL 10 MG RE SUPP
10.0000 mg | Freq: Every day | RECTAL | Status: DC | PRN
Start: 2014-03-09 — End: 2014-03-11

## 2014-03-09 MED ORDER — RIVAROXABAN 10 MG PO TABS
10.0000 mg | ORAL_TABLET | Freq: Every day | ORAL | Status: DC
  Administered 2014-03-10 – 2014-03-11 (×2): 10 mg via ORAL
  Filled 2014-03-09 (×3): qty 1

## 2014-03-09 MED ORDER — HYDROMORPHONE HCL PF 1 MG/ML IJ SOLN: 0.2500 mg | INTRAMUSCULAR | Status: DC | PRN

## 2014-03-09 MED ORDER — OXYCODONE HCL 5 MG PO TABS
5.0000 mg | ORAL_TABLET | ORAL | Status: DC | PRN
  Administered 2014-03-09 – 2014-03-11 (×13): 10 mg via ORAL
  Filled 2014-03-09 (×13): qty 2

## 2014-03-09 MED ORDER — MIDAZOLAM HCL 5 MG/5ML IJ SOLN
INTRAMUSCULAR | Status: DC | PRN
  Administered 2014-03-09: 0.5 mg via INTRAVENOUS
  Administered 2014-03-09: 1 mg via INTRAVENOUS
  Administered 2014-03-09: 0.5 mg via INTRAVENOUS
  Administered 2014-03-09 (×2): 1 mg via INTRAVENOUS

## 2014-03-09 MED ORDER — HYDROMORPHONE HCL PF 1 MG/ML IJ SOLN
0.5000 mg | INTRAMUSCULAR | Status: DC | PRN
Start: 2014-03-09 — End: 2014-03-11
  Administered 2014-03-09 – 2014-03-10 (×5): 1 mg via INTRAVENOUS
  Filled 2014-03-09 (×4): qty 1

## 2014-03-09 MED ORDER — CHLORHEXIDINE GLUCONATE 4 % EX LIQD: 60.0000 mL | Freq: Once | CUTANEOUS | Status: DC

## 2014-03-09 MED ORDER — DEXTROSE 5 % IV SOLN
500.0000 mg | Freq: Four times a day (QID) | INTRAVENOUS | Status: DC | PRN
  Administered 2014-03-09: 500 mg via INTRAVENOUS
  Filled 2014-03-09: qty 5

## 2014-03-09 MED ORDER — FLEET ENEMA 7-19 GM/118ML RE ENEM: 1.0000 | ENEMA | Freq: Once | RECTAL | Status: AC | PRN

## 2014-03-09 MED ORDER — BUPIVACAINE LIPOSOME 1.3 % IJ SUSP
20.0000 mL | Freq: Once | INTRAMUSCULAR | Status: DC
  Filled 2014-03-09: qty 20

## 2014-03-09 MED ORDER — KETOROLAC TROMETHAMINE 15 MG/ML IJ SOLN
7.5000 mg | Freq: Four times a day (QID) | INTRAMUSCULAR | Status: AC | PRN
  Administered 2014-03-09 – 2014-03-10 (×2): 7.5 mg via INTRAVENOUS
  Filled 2014-03-09 (×2): qty 1

## 2014-03-09 MED ORDER — BUPIVACAINE HCL 0.25 % IJ SOLN
INTRAMUSCULAR | Status: DC | PRN
  Administered 2014-03-09: 20 mL

## 2014-03-09 MED ORDER — SODIUM CHLORIDE 0.9 % IJ SOLN
INTRAMUSCULAR | Status: AC
  Filled 2014-03-09: qty 50

## 2014-03-09 MED ORDER — DOCUSATE SODIUM 100 MG PO CAPS
100.0000 mg | ORAL_CAPSULE | Freq: Two times a day (BID) | ORAL | Status: DC
  Administered 2014-03-09 – 2014-03-11 (×5): 100 mg via ORAL

## 2014-03-09 MED ORDER — PROPOFOL INFUSION 10 MG/ML OPTIME
INTRAVENOUS | Status: DC | PRN
  Administered 2014-03-09: 50 ug/kg/min via INTRAVENOUS

## 2014-03-09 MED ORDER — ZOLPIDEM TARTRATE 5 MG PO TABS: 5.0000 mg | ORAL_TABLET | Freq: Every evening | ORAL | Status: DC | PRN

## 2014-03-09 MED ORDER — ACETAMINOPHEN 325 MG PO TABS: 650.0000 mg | ORAL_TABLET | Freq: Four times a day (QID) | ORAL | Status: DC | PRN

## 2014-03-09 MED ORDER — MENTHOL 3 MG MT LOZG
1.0000 | LOZENGE | OROMUCOSAL | Status: DC | PRN
  Filled 2014-03-09: qty 9

## 2014-03-09 MED ORDER — DIPHENHYDRAMINE HCL 12.5 MG/5ML PO ELIX: 12.5000 mg | ORAL_SOLUTION | ORAL | Status: DC | PRN

## 2014-03-09 MED ORDER — METHOCARBAMOL 500 MG PO TABS
500.0000 mg | ORAL_TABLET | Freq: Four times a day (QID) | ORAL | Status: DC | PRN
  Administered 2014-03-10 – 2014-03-11 (×6): 500 mg via ORAL
  Filled 2014-03-09 (×6): qty 1

## 2014-03-09 MED ORDER — MIDAZOLAM HCL 2 MG/2ML IJ SOLN
INTRAMUSCULAR | Status: AC
  Filled 2014-03-09: qty 2

## 2014-03-09 MED ORDER — CEFAZOLIN SODIUM-DEXTROSE 2-3 GM-% IV SOLR
2.0000 g | Freq: Four times a day (QID) | INTRAVENOUS | Status: AC
  Administered 2014-03-09 (×2): 2 g via INTRAVENOUS
  Filled 2014-03-09 (×3): qty 50

## 2014-03-09 MED ORDER — ACETAMINOPHEN 10 MG/ML IV SOLN
1000.0000 mg | Freq: Once | INTRAVENOUS | Status: AC
  Administered 2014-03-09: 1000 mg via INTRAVENOUS
  Filled 2014-03-09: qty 100

## 2014-03-09 MED ORDER — BUPIVACAINE HCL (PF) 0.25 % IJ SOLN
INTRAMUSCULAR | Status: AC
  Filled 2014-03-09: qty 30

## 2014-03-09 MED ORDER — BUPIVACAINE LIPOSOME 1.3 % IJ SUSP
INTRAMUSCULAR | Status: DC | PRN
  Administered 2014-03-09: 20 mL

## 2014-03-09 MED ORDER — POLYETHYLENE GLYCOL 3350 17 G PO PACK: 17.0000 g | PACK | Freq: Every day | ORAL | Status: DC | PRN

## 2014-03-09 MED ORDER — 0.9 % SODIUM CHLORIDE (POUR BTL) OPTIME
TOPICAL | Status: DC | PRN
  Administered 2014-03-09: 1000 mL

## 2014-03-09 MED ORDER — ACETAMINOPHEN 650 MG RE SUPP: 650.0000 mg | Freq: Four times a day (QID) | RECTAL | Status: DC | PRN

## 2014-03-09 MED ORDER — SODIUM CHLORIDE 0.9 % IR SOLN
Status: DC | PRN
  Administered 2014-03-09: 1000 mL

## 2014-03-09 SURGICAL SUPPLY — 66 items
BAG SPEC THK2 15X12 ZIP CLS (MISCELLANEOUS)
BAG ZIPLOCK 12X15 (MISCELLANEOUS) ×1 IMPLANT
BANDAGE ELASTIC 6 VELCRO ST LF (GAUZE/BANDAGES/DRESSINGS) ×3 IMPLANT
BANDAGE ESMARK 6X9 LF (GAUZE/BANDAGES/DRESSINGS) ×1 IMPLANT
BLADE SAG 18X100X1.27 (BLADE) ×3 IMPLANT
BLADE SAW SGTL 11.0X1.19X90.0M (BLADE) ×3 IMPLANT
BNDG ADH 5X2 AIR PERM ELC (GAUZE/BANDAGES/DRESSINGS) ×1
BNDG CMPR 9X6 STRL LF SNTH (GAUZE/BANDAGES/DRESSINGS) ×1
BNDG COHESIVE 2X5 WHT NS (GAUZE/BANDAGES/DRESSINGS) ×2 IMPLANT
BNDG ESMARK 6X9 LF (GAUZE/BANDAGES/DRESSINGS) ×3
BOWL SMART MIX CTS (DISPOSABLE) ×3 IMPLANT
CAP KNEE ATTUNE RP ×3 IMPLANT
CEMENT HV SMART SET (Cement) ×6 IMPLANT
CLOSURE WOUND 1/2 X4 (GAUZE/BANDAGES/DRESSINGS) ×2
CUFF TOURN SGL QUICK 34 (TOURNIQUET CUFF) ×3
CUFF TRNQT CYL 34X4X40X1 (TOURNIQUET CUFF) ×1 IMPLANT
DECANTER SPIKE VIAL GLASS SM (MISCELLANEOUS) ×3 IMPLANT
DRAPE EXTREMITY T 121X128X90 (DRAPE) ×3 IMPLANT
DRAPE POUCH INSTRU U-SHP 10X18 (DRAPES) ×3 IMPLANT
DRAPE U-SHAPE 47X51 STRL (DRAPES) ×3 IMPLANT
DRSG ADAPTIC 3X8 NADH LF (GAUZE/BANDAGES/DRESSINGS) ×3 IMPLANT
DURAPREP 26ML APPLICATOR (WOUND CARE) ×3 IMPLANT
ELECT REM PT RETURN 9FT ADLT (ELECTROSURGICAL) ×3
ELECTRODE REM PT RTRN 9FT ADLT (ELECTROSURGICAL) ×1 IMPLANT
EVACUATOR 1/8 PVC DRAIN (DRAIN) ×3 IMPLANT
FACESHIELD WRAPAROUND (MASK) ×15 IMPLANT
FACESHIELD WRAPAROUND OR TEAM (MASK) ×5 IMPLANT
GLOVE BIO SURGEON STRL SZ8 (GLOVE) ×5 IMPLANT
GLOVE BIOGEL PI IND STRL 7.0 (GLOVE) IMPLANT
GLOVE BIOGEL PI IND STRL 8 (GLOVE) ×1 IMPLANT
GLOVE BIOGEL PI INDICATOR 7.0 (GLOVE) ×2
GLOVE BIOGEL PI INDICATOR 8 (GLOVE) ×2
GLOVE ECLIPSE 8.0 STRL XLNG CF (GLOVE) ×2 IMPLANT
GLOVE SURG SS PI 6.5 STRL IVOR (GLOVE) ×2 IMPLANT
GLOVE SURG SS PI 7.0 STRL IVOR (GLOVE) ×6 IMPLANT
GOWN SRG XL XLNG 56XLVL 4 (GOWN DISPOSABLE) IMPLANT
GOWN STRL NON-REIN XL XLG LVL4 (GOWN DISPOSABLE) ×3
GOWN STRL REUS W/TWL XL LVL3 (GOWN DISPOSABLE) ×9 IMPLANT
HANDPIECE INTERPULSE COAX TIP (DISPOSABLE) ×3
IMMOBILIZER KNEE 20 (SOFTGOODS) ×3 IMPLANT
KIT BASIN OR (CUSTOM PROCEDURE TRAY) ×3 IMPLANT
MANIFOLD NEPTUNE II (INSTRUMENTS) ×3 IMPLANT
NDL HYPO 21X1.5 SAFETY (NEEDLE) IMPLANT
NDL SAFETY ECLIPSE 18X1.5 (NEEDLE) ×2 IMPLANT
NEEDLE HYPO 18GX1.5 SHARP (NEEDLE) ×6
NEEDLE HYPO 21X1.5 SAFETY (NEEDLE) ×3 IMPLANT
PACK TOTAL JOINT (CUSTOM PROCEDURE TRAY) ×3 IMPLANT
PAD ABD 8X10 STRL (GAUZE/BANDAGES/DRESSINGS) ×2 IMPLANT
PADDING CAST COTTON 6X4 STRL (CAST SUPPLIES) ×9 IMPLANT
POSITIONER SURGICAL ARM (MISCELLANEOUS) ×3 IMPLANT
SET HNDPC FAN SPRY TIP SCT (DISPOSABLE) ×1 IMPLANT
SPONGE GAUZE 4X4 12PLY (GAUZE/BANDAGES/DRESSINGS) ×3 IMPLANT
STRIP CLOSURE SKIN 1/2X4 (GAUZE/BANDAGES/DRESSINGS) ×4 IMPLANT
SUCTION FRAZIER 12FR DISP (SUCTIONS) ×3 IMPLANT
SUT MNCRL AB 4-0 PS2 18 (SUTURE) ×3 IMPLANT
SUT VIC AB 2-0 CT1 27 (SUTURE) ×9
SUT VIC AB 2-0 CT1 TAPERPNT 27 (SUTURE) ×3 IMPLANT
SUT VLOC 180 0 24IN GS25 (SUTURE) ×3 IMPLANT
SYR 20CC LL (SYRINGE) ×3 IMPLANT
SYR 3ML LL SCALE MARK (SYRINGE) ×3 IMPLANT
SYR 50ML LL SCALE MARK (SYRINGE) ×3 IMPLANT
TOWEL OR 17X26 10 PK STRL BLUE (TOWEL DISPOSABLE) ×3 IMPLANT
TOWEL OR NON WOVEN STRL DISP B (DISPOSABLE) ×2 IMPLANT
TRAY FOLEY CATH 14FRSI W/METER (CATHETERS) ×3 IMPLANT
WATER STERILE IRR 1500ML POUR (IV SOLUTION) ×3 IMPLANT
WRAP KNEE MAXI GEL POST OP (GAUZE/BANDAGES/DRESSINGS) ×3 IMPLANT

## 2014-03-09 NOTE — Interval H&P Note (Signed)
History and Physical Interval Note:  03/09/2014 6:54 AM  Stephanie Blake  has presented today for surgery, with the diagnosis of OA OF RIGHT KNEE  The various methods of treatment have been discussed with the patient and family. After consideration of risks, benefits and other options for treatment, the patient has consented to  Procedure(s): RIGHT TOTAL KNEE ARTHROPLASTY (Right) as a surgical intervention .  The patient's history has been reviewed, patient examined, no change in status, stable for surgery.  I have reviewed the patient's chart and labs.  Questions were answered to the patient's satisfaction.     Pilar Plate [name] V

## 2014-03-09 NOTE — Anesthesia Procedure Notes (Signed)
Spinal  Patient location during procedure: OR Start time: 03/09/2014 7:20 AM End time: 03/09/2014 7:25 AM Staffing Anesthesiologist: Rod Mae L Performed by: anesthesiologist  Preanesthetic Checklist Completed: patient identified, site marked, surgical consent, pre-op evaluation, timeout performed, IV checked, risks and benefits discussed and monitors and equipment checked Spinal Block Patient position: sitting Prep: Betadine Patient monitoring: heart rate, continuous pulse ox and blood pressure Approach: midline Location: L3-4 Injection technique: single-shot Needle Needle type: [place]  Needle gauge: 25 G Needle length: 9 cm Assessment Sensory level: T6 Additional Notes Expiration date of kit checked and confirmed. Patient tolerated procedure well, without complications.

## 2014-03-09 NOTE — Progress Notes (Signed)
Physical Therapy Treatment Patient Details Name: Stephanie Blake MRN: [id number] DOB: 10-29-1955 Today's Date: 03/09/2014    History of Present Illness 58 yo female s/p R TKA 03/09/14.     PT Comments    Pt requesting to return to bed. Assisted pt back to bed.   Follow Up Recommendations  Home health PT;Supervision for mobility/OOB     Equipment Recommendations  Rolling walker with 5" wheels    Recommendations for Other Services OT consult     Precautions / Restrictions Precautions Precautions: Knee;Fall Required Braces or Orthoses: Knee Immobilizer - Right Knee Immobilizer - Right: Discontinue once straight leg raise with < 10 degree lag Restrictions Weight Bearing Restrictions: No RLE Weight Bearing: Weight bearing as tolerated    Mobility  Bed Mobility Overal bed mobility: Needs Assistance Bed Mobility: Sit to Supine     Supine to sit: Min assist Sit to supine: Min assist   General bed mobility comments: assist for R LE  Transfers Overall transfer level: Needs assistance Equipment used: Rolling walker (2 wheeled) Transfers: Sit to/from [facility] Sit to Stand: Min assist Stand pivot transfers: Min assist       General transfer comment: assist to rise, stabilize ,control descent. VCs safety, technique, hand placement.   Ambulation/Gait Ambulation/Gait assistance: Min assist Ambulation Distance (Feet): 35 Feet Assistive device: Rolling walker (2 wheeled) Gait Pattern/deviations: Step-to pattern;Antalgic;Decreased stride length;Decreased step length - right;Decreased step length - left     General Gait Details: assist to stabilize throughout ambulation. VCs safety, technique, sequence. Pt tolerated activity well.    Stairs            Wheelchair Mobility    Modified Rankin (Stroke Patients Only)       Balance                                    Cognition Arousal/Alertness: Awake/alert Behavior During Therapy:  Anxious Overall Cognitive Status: Within Functional Limits for tasks assessed                      Exercises      General Comments        Pertinent Vitals/Pain 5/10 R knee with activity. Ice applied end of session    [place] expects to be discharged to:: Private residence Living Arrangements: Spouse/significant other Available Help at Discharge: Family Type of Home: House Home Access: Stairs to enter Entrance Stairs-Rails: Right Home Layout: Multi-level;Able to live on main level with bedroom/bathroom Home Equipment: Bedside commode;Cane - single point      Prior Function Level of Independence: Independent          PT Goals (current goals can now be found in the care plan section) Acute Rehab PT Goals Patient Stated Goal: home soon PT Goal Formulation: With patient Time For Goal Achievement: 03/23/14 Potential to Achieve Goals: Good Progress towards PT goals: Progressing toward goals    Frequency  7X/week    PT Plan Current plan remains appropriate    Co-evaluation             End of Session Equipment Utilized During Treatment: Gait belt Activity Tolerance: Patient tolerated treatment well Patient left: in bed;with call bell/phone within reach;with family/visitor present;with nursing/sitter in room     Time: 1730-1740 PT Time Calculation (min): 10 min  Charges:  $Gait Training: 8-22 mins $Therapeutic Activity: 8-22 mins  G Codes:      Weston Anna, MPT Pager: [phone]

## 2014-03-09 NOTE — Addendum Note (Signed)
Addendum created 03/09/14 0957 by Peyton Najjar, MD   Modules edited: Anesthesia Attestations

## 2014-03-09 NOTE — Anesthesia Postprocedure Evaluation (Signed)
  Anesthesia Post-op Note  Patient: Stephanie Blake  Procedure(s) Performed: Procedure(s) (LRB): RIGHT TOTAL KNEE ARTHROPLASTY, INJECTION LEFT KNEE (Right)  Patient Location: PACU  Anesthesia Type: Spinal  Level of Consciousness: awake and alert   Airway and Oxygen Therapy: Patient Spontanous Breathing  Post-op Pain: mild  Post-op Assessment: Post-op Vital signs reviewed, Patient's Cardiovascular Status Stable, Respiratory Function Stable, Patent Airway and No signs of Nausea or vomiting  Last Vitals:  Filed Vitals:   03/09/14 0945  BP: 109/65  Pulse: 66  Temp: 36.3 C  Resp: 15    Post-op Vital Signs: stable   Complications: No apparent anesthesia complications

## 2014-03-09 NOTE — Evaluation (Signed)
Physical Therapy Evaluation Patient Details Name: Stephanie Blake MRN: [id number] DOB: 27-Nov-1955 Today's Date: 03/09/2014   History of Present Illness  58 yo female s/p R TKA 03/09/14.   Clinical Impression  On eval, pt required Min assist for mobility-able to ambulate ~35 feet with rolling walker. Anticipate pt will progress well during stay. Recommend HHPT.     Follow Up Recommendations Home health PT;Supervision for mobility/OOB    Equipment Recommendations  Rolling walker with 5" wheels    Recommendations for Other Services OT consult     Precautions / Restrictions Precautions Precautions: Fall;Knee Required Braces or Orthoses: Knee Immobilizer - Right Knee Immobilizer - Right: Discontinue once straight leg raise with < 10 degree lag Restrictions Weight Bearing Restrictions: No RLE Weight Bearing: Weight bearing as tolerated      Mobility  Bed Mobility Overal bed mobility: Needs Assistance Bed Mobility: Supine to Sit;Sit to Supine     Supine to sit: Min assist Sit to supine: Min assist   General bed mobility comments: Assist for R LE onto/off bed. VCs safety, technique, hand placement  Transfers Overall transfer level: Needs assistance Equipment used: Rolling walker (2 wheeled) Transfers: Sit to/from Stand Sit to Stand: Min assist;From elevated surface         General transfer comment: assist to rise, stabilize ,control descent. VCs safety, technique, hand placement.   Ambulation/Gait Ambulation/Gait assistance: Min assist Ambulation Distance (Feet): 35 Feet Assistive device: Rolling walker (2 wheeled) Gait Pattern/deviations: Step-to pattern;Antalgic;Decreased stride length;Decreased step length - right;Decreased step length - left     General Gait Details: assist to stabilize throughout ambulation. VCs safety, technique, sequence. Pt tolerated activity well.   Stairs            Wheelchair Mobility    Modified Rankin (Stroke Patients Only)        Balance                                             Pertinent Vitals/Pain 5/10 R knee with activity. Ice applied end of session.     Home Living Family/patient expects to be discharged to:: Private residence Living Arrangements: Spouse/significant other Available Help at Discharge: Family Type of Home: House Home Access: Stairs to enter Entrance Stairs-Rails: Right Entrance Stairs-Number of Steps: 3 Home Layout: Multi-level;Able to live on main level with bedroom/bathroom Home Equipment: Bedside commode;Cane - single point      Prior Function Level of Independence: Independent               Hand Dominance        Extremity/Trunk Assessment   Upper Extremity Assessment: Overall WFL for tasks assessed           Lower Extremity Assessment: RLE deficits/detail RLE Deficits / Details: hip flex 2/5, hip abd/add 2/5, moves ankle well.     Cervical / Trunk Assessment: Normal  Communication   Communication: No difficulties  Cognition Arousal/Alertness: Awake/alert Behavior During Therapy: Anxious Overall Cognitive Status: Within Functional Limits for tasks assessed                      General Comments      Exercises        Assessment/Plan    PT Assessment Patient needs continued PT services  PT Diagnosis Difficulty walking;Abnormality of gait;Acute pain   PT Problem List Decreased strength;Decreased activity  tolerance;Decreased balance;Decreased mobility;Pain;Decreased knowledge of use of DME  PT Treatment Interventions DME instruction;Gait training;Stair training;Functional mobility training;Therapeutic exercise;Patient/family education;Balance training   PT Goals (Current goals can be found in the Care Plan section) Acute Rehab PT Goals Patient Stated Goal: home soon PT Goal Formulation: With patient Time For Goal Achievement: 03/23/14 Potential to Achieve Goals: Good    Frequency 7X/week   Barriers to discharge         Co-evaluation               End of Session Equipment Utilized During Treatment: Gait belt Activity Tolerance: Patient tolerated treatment well Patient left: in chair;with call bell/phone within reach;with family/visitor present           Time: 3893-7342 PT Time Calculation (min): 22 min   Charges:   PT Evaluation $Initial PT Evaluation Tier I: 1 Procedure PT Treatments $Gait Training: 8-22 mins   PT G Codes:          Weston Anna, MPT Pager: [phone]

## 2014-03-09 NOTE — Op Note (Signed)
Pre-operative diagnosis- Osteoarthritis  Bilateral knee(s)  Post-operative diagnosis- Osteoarthritis Bilateral knee(s)  Procedure-  Right  Total Knee Arthroplasty (Attune system)   Left knee cortisone injection  Surgeon- Stephanie Plover. Vedh Ptacek, MD  Assistant- Molli Barrows, PA-C   Anesthesia-  Spinal  EBL-* No blood loss amount entered *   Drains Hemovac  Tourniquet time-  Total Tourniquet Time Documented: Thigh (Right) - 36 minutes Total: Thigh (Right) - 36 minutes     Complications- None  Condition-PACU - hemodynamically stable.   Brief Clinical Note  Stephanie Blake is a 58 y.o. year old female with end stage OA of both knees with progressively worsening pain and dysfunction. She has constant pain, with activity and at rest and significant functional deficits with difficulties even with ADLs. She has had extensive non-op management including analgesics, injections of cortisone and viscosupplements, and home exercise program, but remains in significant pain with significant dysfunction.Radiographs show bone on bone arthritis medial and patellofemoral. She presents now for right Total Knee Arthroplasty and left knee cortisone injection.    Procedure in detail---   The patient is brought into the operating room and positioned supine on the operating table. After successful administration of  Spinal,   a tourniquet is placed high on the  Right thigh(s) and the lower extremity is prepped and draped in the usual sterile fashion. Time out is performed by the operating team and then the  Right lower extremity is wrapped in Esmarch, knee flexed and the tourniquet inflated to 300 mmHg.       A midline incision is made with a ten blade through the subcutaneous tissue to the level of the extensor mechanism. A fresh blade is used to make a medial parapatellar arthrotomy. Soft tissue over the proximal medial tibia is subperiosteally elevated to the joint line with a knife and into the semimembranosus  bursa with a Cobb elevator. Soft tissue over the proximal lateral tibia is elevated with attention being paid to avoiding the patellar tendon on the tibial tubercle. The patella is everted, knee flexed 90 degrees and the ACL and PCL are removed. Findings are bone on bone medial and patellofemoral with large medial osteophytes.        The drill is used to create a starting hole in the distal femur and the canal is thoroughly irrigated with sterile saline to remove the fatty contents. The 5 degree Right  valgus alignment guide is placed into the femoral canal and the distal femoral cutting block is pinned to remove 9 mm off the distal femur. Resection is made with an oscillating saw.      The tibia is subluxed forward and the menisci are removed. The extramedullary alignment guide is placed referencing proximally at the medial aspect of the tibial tubercle and distally along the second metatarsal axis and tibial crest. The block is pinned to remove 31mm off the more deficient medial  side. Resection is made with an oscillating saw. Size 5is the most appropriate size for the tibia and the proximal tibia is prepared with the modular drill and keel punch for that size.      The femoral sizing guide is placed and size 5 is most appropriate. Rotation is marked off the epicondylar axis and confirmed by creating a rectangular flexion gap at 90 degrees. The size 5 cutting block is pinned in this rotation and the anterior, posterior and chamfer cuts are made with the oscillating saw. The intercondylar block is then placed and that cut is  made.      Trial size 5 tibial component, trial size 5 posterior stabilized femur and a 10  mm posterior stabilized rotating platform insert trial is placed. Full extension is achieved with excellent varus/valgus and anterior/posterior balance throughout full range of motion. The patella is everted and thickness measured to be 24  mm. Free hand resection is taken to 14 mm, a 38 template is  placed, lug holes are drilled, trial patella is placed, and it tracks normally. Osteophytes are removed off the posterior femur with the trial in place. All trials are removed and the cut bone surfaces prepared with pulsatile lavage. Cement is mixed and once ready for implantation, the size 5 tibial implant, size  5 posterior stabilized femoral component, and the size 38 patella are cemented in place and the patella is held with the clamp. The trial insert is placed and the knee held in full extension. The Exparel (20 ml mixed with 30 ml saline) and .25% Bupivicaine, are injected into the extensor mechanism, posterior capsule, medial and lateral gutters and subcutaneous tissues.  All extruded cement is removed and once the cement is hard the permanent 10 mm posterior stabilized rotating platform insert is placed into the tibial tray.      The wound is copiously irrigated with saline solution and the extensor mechanism closed over a hemovac drain with #1 V-loc suture. The tourniquet is released for a total tourniquet time of 36  minutes. Flexion against gravity is 140 degrees and the patella tracks normally. Subcutaneous tissue is closed with 2.0 vicryl and subcuticular with running 4.0 Monocryl. The incision is cleaned and dried and steri-strips and a bulky sterile dressing are applied. The limb is placed into a knee immobilizer.       I prepped the left knee with Betadine and under sterile conditions injected the left knee with 80 mg Depomedrol. She tolerated this well without problems.       The patient is awakened and transported to recovery in stable condition.      I prepped the left knee with Betadine and under sterile conditions injected the left knee with 80 mg Depomedrol. She tolerated this well without problems.      Please note that a surgical assistant was a medical necessity for this procedure in order to perform it in a safe and expeditious manner. Surgical assistant was necessary to retract the  ligaments and vital neurovascular structures to prevent injury to them and also necessary for proper positioning of the limb to allow for anatomic placement of the prosthesis.   Stephanie Plover Cassy Sprowl, MD    03/09/2014, 8:22 AM

## 2014-03-09 NOTE — Transfer of Care (Signed)
Immediate Anesthesia Transfer of Care Note  Patient: Stephanie Blake  Procedure(s) Performed: Procedure(s): RIGHT TOTAL KNEE ARTHROPLASTY, INJECTION LEFT KNEE (Right)  Patient Location: PACU  Anesthesia Type:Spinal  Level of Consciousness: awake, alert , oriented and patient cooperative  Airway & Oxygen Therapy: Patient Spontanous Breathing and Patient connected to face mask oxygen  Post-op Assessment: Report given to PACU RN and Post -op Vital signs reviewed and stable  Post vital signs: Reviewed and stable  Complications: No apparent anesthesia complications

## 2014-03-10 LAB — BASIC METABOLIC PANEL
BUN: 13 mg/dL (ref 6–23)
CHLORIDE: 106 meq/L (ref 96–112)
CO2: 27 mEq/L (ref 19–32)
Calcium: 8.5 mg/dL (ref 8.4–10.5)
Creatinine, Ser: 0.74 mg/dL (ref 0.50–1.10)
GFR calc Af Amer: >90 mL/min (ref >90)
Glucose, Bld: 165 mg/dL — ABNORMAL HIGH (ref 70–99)
POTASSIUM: 3.9 meq/L (ref 3.7–5.3)
SODIUM: 140 meq/L (ref 137–147)

## 2014-03-10 LAB — CBC
HEMATOCRIT: 31.9 % — AB (ref 36.0–46.0)
Hemoglobin: 10.8 g/dL — ABNORMAL LOW (ref 12.0–15.0)
MCH: 31.5 pg (ref 26.0–34.0)
MCHC: 33.9 g/dL (ref 30.0–36.0)
MCV: 93 fL (ref 78.0–100.0)
Platelets: 210 10*3/uL (ref 150–400)
RBC: 3.43 MIL/uL — ABNORMAL LOW (ref 3.87–5.11)
RDW: 14.1 % (ref 11.5–15.5)
WBC: 13.4 10*3/uL — AB (ref 4.0–10.5)

## 2014-03-10 MED ORDER — METHOCARBAMOL 500 MG PO TABS: 500.0000 mg | ORAL_TABLET | Freq: Four times a day (QID) | ORAL | Status: DC | PRN

## 2014-03-10 MED ORDER — TRAMADOL HCL 50 MG PO TABS: 50.0000 mg | ORAL_TABLET | Freq: Four times a day (QID) | ORAL | Status: DC | PRN

## 2014-03-10 MED ORDER — OXYCODONE HCL 5 MG PO TABS: 5.0000 mg | ORAL_TABLET | ORAL | Status: DC | PRN

## 2014-03-10 MED ORDER — RIVAROXABAN 10 MG PO TABS: 10.0000 mg | ORAL_TABLET | Freq: Every day | ORAL | Status: DC

## 2014-03-10 MED ORDER — DSS 100 MG PO CAPS: 100.0000 mg | ORAL_CAPSULE | Freq: Two times a day (BID) | ORAL | Status: DC

## 2014-03-10 NOTE — Evaluation (Signed)
Occupational Therapy Evaluation Patient Details Name: ZEMA LIZARDO MRN: [id number] DOB: March 19, 1956 Today's Date: 03/10/2014    History of Present Illness 58 yo female s/p R TKA 03/09/14.    Clinical Impression   Pt doing well and was min assist with toilet transfer into the bathroom with the walker today. Husband present for education. Will benefit from skilled OT services while on acute to progress ADL independence for d/c home with spouse. Pt with some nausea as she came out of the bathroom and had a vomiting episode after transferring into the chair. She states she felt much better afterwards and nursing made aware.    Follow Up Recommendations  No OT follow up;Supervision/Assistance - 24 hour    Equipment Recommendations  None recommended by OT    Recommendations for Other Services       Precautions / Restrictions Precautions Precautions: Knee;Fall Required Braces or Orthoses: Knee Immobilizer - Right Knee Immobilizer - Right: Discontinue once straight leg raise with < 10 degree lag Restrictions Weight Bearing Restrictions: No RLE Weight Bearing: Weight bearing as tolerated      Mobility Bed Mobility Overal bed mobility: Needs Assistance Bed Mobility: Supine to Sit     Supine to sit: Min guard        Transfers Overall transfer level: Needs assistance Equipment used: Rolling walker (2 wheeled) Transfers: Sit to/from Stand Sit to Stand: Min assist         General transfer comment: verbal cues for hand placement and assist to rise and steady. Cues for LE management.    Balance                                            ADL Overall ADL's : Needs assistance/impaired Eating/Feeding: Independent;Sitting   Grooming: Wash/dry hands;Set up;Sitting   Upper Body Bathing: Set up;Sitting   Lower Body Bathing: Moderate assistance;Sit to/from stand   Upper Body Dressing : Set up;Sitting   Lower Body Dressing: Moderate assistance;Sit to/from  stand   Toilet Transfer: Minimal assistance;Ambulation;BSC;RW   Toileting- Clothing Manipulation and Hygiene: Minimal assistance;Sit to/from stand         General ADL Comments: Discussed AE options for LB self care and coverage  and pt states husband there to assist also. Pt a little bit anxious during session but with repetition of information, pt feeling more confident with functional transfers and ADl. Reviewed sequence for LB dressing. Discussed use of 3in1 as a shower chair also.      Vision                     Perception     Praxis      Pertinent Vitals/Pain 2/10 at rest R knee 5/10 with activity R knee; reposition, ice     Hand Dominance     Extremity/Trunk Assessment Upper Extremity Assessment Upper Extremity Assessment: Overall WFL for tasks assessed           Communication Communication Communication: No difficulties   Cognition Arousal/Alertness: Awake/alert Behavior During Therapy: Anxious Overall Cognitive Status: Within Functional Limits for tasks assessed                     General Comments       Exercises       Shoulder Instructions      Home Living Family/patient expects to be discharged to:: Private  residence Living Arrangements: Spouse/significant other Available Help at Discharge: Family Type of Home: House Home Access: Stairs to enter Technical brewer of Steps: 3 Entrance Stairs-Rails: Right Home Layout: Multi-level;Able to live on main level with bedroom/bathroom     Bathroom Shower/Tub: Occupational psychologist: Standard     Home Equipment: Bedside commode;Cane - single point          Prior Functioning/Environment Level of Independence: Independent             OT Diagnosis: Generalized weakness   OT Problem List: Decreased strength;Decreased knowledge of use of DME or AE   OT Treatment/Interventions: Self-care/ADL training;Patient/family education;Therapeutic activities;DME and/or  AE instruction    OT Goals(Current goals can be found in the care plan section) Acute Rehab OT Goals Patient Stated Goal: home soon OT Goal Formulation: With patient/family Time For Goal Achievement: 03/17/14 Potential to Achieve Goals: Good  OT Frequency: Min 2X/week   Barriers to D/C:            Co-evaluation              End of Session Equipment Utilized During Treatment: Gait belt;Rolling walker;Right knee immobilizer CPM Right Knee CPM Right Knee: Off  Activity Tolerance: Patient tolerated treatment well Patient left: in chair;with call bell/phone within reach;with family/visitor present   Time: 1000-1030 OT Time Calculation (min): 30 min Charges:  OT General Charges $OT Visit: 1 Procedure OT Evaluation $Initial OT Evaluation Tier I: 1 Procedure OT Treatments $Self Care/Home Management : 8-22 mins $Therapeutic Activity: 8-22 mins G-Codes:    Alycia Patten Jeanette Rauth [phone] 03/10/2014, 10:46 AM

## 2014-03-10 NOTE — Progress Notes (Signed)
Physical Therapy Treatment Patient Details Name: Stephanie Blake MRN: [id number] DOB: 1956/06/18 Today's Date: 03/10/2014    History of Present Illness 58 yo female s/p R TKA 03/09/14.     PT Comments    Progressing slowly with mobility and knee ROM. Discussed importance of knee ROM exercises early on in healing process. Pt continues to get tearful while working with therapy-pt states she is okay however.   Follow Up Recommendations  Home health PT;Supervision for mobility/OOB     Equipment Recommendations  Rolling walker with 5" wheels    Recommendations for Other Services OT consult     Precautions / Restrictions Precautions Precautions: Knee;Fall Required Braces or Orthoses: Knee Immobilizer - Right Knee Immobilizer - Right: Discontinue once straight leg raise with < 10 degree lag Restrictions Weight Bearing Restrictions: No RLE Weight Bearing: Weight bearing as tolerated    Mobility  Bed Mobility Overal bed mobility: Needs Assistance Bed Mobility: Sit to Supine     Supine to sit: Min guard Sit to supine: Min assist   General bed mobility comments: assist for R LE  Transfers Overall transfer level: Needs assistance Equipment used: Rolling walker (2 wheeled) Transfers: Sit to/from Stand Sit to Stand: Min guard Stand pivot transfers: Min guard       General transfer comment: verbal cues for hand placement,LE management. close guard for safety  Ambulation/Gait Ambulation/Gait assistance: Min assist Ambulation Distance (Feet): 100 Feet Assistive device: Rolling walker (2 wheeled) Gait Pattern/deviations: Step-to pattern;Decreased stride length;Antalgic;Decreased weight shift to right;Decreased stance time - right     General Gait Details: assist to stabilize throughout ambulation. VCs safety, technique, sequence.    Stairs            Wheelchair Mobility    Modified Rankin (Stroke Patients Only)       Balance                                     Cognition Arousal/Alertness: Awake/alert Behavior During Therapy: Anxious Overall Cognitive Status: Within Functional Limits for tasks assessed                      Exercises Total Joint Exercises Ankle Circles/Pumps: AROM;Both;10 reps;Supine Quad Sets: AROM;Both;10 reps;Supine Heel Slides: AAROM;Right;10 reps;Supine (poor tolerance for knee flexion) Hip ABduction/ADduction: AAROM;Right;10 reps;Supine Straight Leg Raises: AAROM;Right;10 reps;Supine Goniometric ROM: 10-30 degrees    General Comments        Pertinent Vitals/Pain 7/10 R knee with activity. Ice applied end of session    Stephanie Blake expects to be discharged to:: Private residence Living Arrangements: Spouse/significant other Available Help at Discharge: Family Type of Home: House Home Access: Stairs to enter Entrance Stairs-Rails: Right Home Layout: Multi-level;Able to live on main level with bedroom/bathroom Home Equipment: Bedside commode;Cane - single point      Prior Function Level of Independence: Independent          PT Goals (current goals can now be found in the care plan section) Acute Rehab PT Goals Patient Stated Goal: home soon Progress towards PT goals: Progressing toward goals (slowly)    Frequency  7X/week    PT Plan Current plan remains appropriate    Co-evaluation             End of Session Equipment Utilized During Treatment: Gait belt Activity Tolerance: Patient tolerated treatment well Patient left: in bed;with call bell/phone within reach;with family/visitor  present     Time: 5638-9373 PT Time Calculation (min): 26 min  Charges:  $Gait Training: 8-22 mins $Therapeutic Exercise: 8-22 mins                    G Codes:      Stephanie Blake, MPT Pager: [phone]

## 2014-03-10 NOTE — Progress Notes (Signed)
   Subjective: 1 Day Post-Op Procedure(s) (LRB): RIGHT TOTAL KNEE ARTHROPLASTY, INJECTION LEFT KNEE (Right) Patient reports pain as moderate.   Patient seen in rounds with Dr. Wynelle Link. Patient is well, and has had no acute complaints or problems. She reports that she had a lot of pain yesterday. Slightly improved this morning. No complications overnight. No SOB or chest pain.  We will start therapy today.  Plan is to go Home after hospital stay.  Objective: Vital signs in last 24 hours: Temp:  [97.2 F (36.2 C)-98.7 F (37.1 C)] 97.3 F (36.3 C) (06/09 0556) Pulse Rate:  [57-84] 64 (06/09 0556) Resp:  [9-20] 16 (06/09 0556) BP: (90-146)/(52-99) 95/52 mmHg (06/09 0556) SpO2:  [94 %-100 %] 97 % (06/09 0556) Weight:  [93.157 kg (205 lb 6 oz)] 93.157 kg (205 lb 6 oz) (06/08 1004)  Intake/Output from previous day:  Intake/Output Summary (Last 24 hours) at 03/10/14 0733 Last data filed at 03/10/14 0600  Gross per 24 hour  Intake   4465 ml  Output   3315 ml  Net   1150 ml     Labs:  Recent Labs  03/10/14 0428  HGB 10.8*    Recent Labs  03/10/14 0428  WBC 13.4*  RBC 3.43*  HCT 31.9*  PLT 210    Recent Labs  03/10/14 0428  NA 140  K 3.9  CL 106  CO2 27  BUN 13  CREATININE 0.74  GLUCOSE 165*  CALCIUM 8.5    EXAM General - Patient is Alert and Oriented Extremity - Neurologically intact Neurovascular intact Dorsiflexion/Plantar flexion intact Compartment soft Dressing - dressing C/D/I Motor Function - intact, moving foot and toes well on exam.  Hemovac pulled without difficulty.  Past Medical History  Diagnosis Date  . Hyperlipidemia   . Arthritis     osteoarthritis- knees    Assessment/Plan: 1 Day Post-Op Procedure(s) (LRB): RIGHT TOTAL KNEE ARTHROPLASTY, INJECTION LEFT KNEE (Right) Active Problems:   OA (osteoarthritis) of knee  Estimated body mass index is 32.16 kg/(m^2) as calculated from the following:   Height as of this encounter: 5\' 7"   (1.702 m).   Weight as of this encounter: 93.157 kg (205 lb 6 oz). Advance diet Up with therapy D/C IV fluids when tolerating POs well  DVT Prophylaxis - Xarelto Weight-Bearing as tolerated  D/C O2 and Pulse OX and try on Room Air  Continue therapy today. Prepare for DC home tomorrow.   Ardeen Jourdain, PA-C Orthopaedic Surgery 03/10/2014, 7:33 AM

## 2014-03-10 NOTE — Progress Notes (Signed)
Physical Therapy Treatment Patient Details Name: Stephanie Blake MRN: [id number] DOB: 08-Jun-1956 Today's Date: 03/10/2014    History of Present Illness 58 yo female s/p R TKA 03/09/14.     PT Comments    Progressing with mobility.   Follow Up Recommendations  Home health PT;Supervision for mobility/OOB     Equipment Recommendations  Rolling walker with 5" wheels    Recommendations for Other Services OT consult     Precautions / Restrictions Precautions Precautions: Knee;Fall Required Braces or Orthoses: Knee Immobilizer - Right Knee Immobilizer - Right: Discontinue once straight leg raise with < 10 degree lag Restrictions Weight Bearing Restrictions: No RLE Weight Bearing: Weight bearing as tolerated    Mobility  Bed Mobility Overal bed mobility: Needs Assistance Bed Mobility: Supine to Sit;Sit to Supine     Supine to sit: Min assist Sit to supine: Min assist   General bed mobility comments: assist for R LE  Transfers Overall transfer level: Needs assistance Equipment used: Rolling walker (2 wheeled) Transfers: Sit to/from Stand Sit to Stand: Min guard         General transfer comment: verbal cues for hand placement,LE management. close guard for safety  Ambulation/Gait Ambulation/Gait assistance: Min guard Ambulation Distance (Feet): 103 Feet Assistive device: Rolling walker (2 wheeled) Gait Pattern/deviations: Step-to pattern;Decreased stride length;Step-through pattern;Antalgic;Decreased weight shift to right;Decreased stance time - right     General Gait Details: assist to stabilize throughout ambulation. VCs safety, technique, sequence.    Stairs            Wheelchair Mobility    Modified Rankin (Stroke Patients Only)       Balance                                    Cognition                            Exercises Total Joint Exercises Ankle Circles/Pumps: AROM;Both;10 reps;Supine Quad Sets: AROM;Both;10  reps;Supine Heel Slides: AAROM;Right;10 reps;Supine (poor tolerance for knee flexion) Hip ABduction/ADduction: AAROM;Right;10 reps;Supine Straight Leg Raises: AAROM;Right;10 reps;Supine Goniometric ROM: 10-30 degrees    General Comments        Pertinent Vitals/Pain 6/10 R knee with activity. Ice applied end of session    Home Living                      Prior Function            PT Goals (current goals can now be found in the care plan section) Progress towards PT goals: Progressing toward goals    Frequency  7X/week    PT Plan Current plan remains appropriate    Co-evaluation             End of Session Equipment Utilized During Treatment: Gait belt Activity Tolerance: Patient tolerated treatment well Patient left: in bed;with call bell/phone within reach     Time: 1505-6979 PT Time Calculation (min): 12 min  Charges:  $Gait Training: 8-22 mins $Therapeutic Exercise: 8-22 mins                    G Codes:      Weston Anna, MPT Pager: [phone]

## 2014-03-10 NOTE — Discharge Instructions (Addendum)
° °Dr. Toris Laverdiere °Total Joint Specialist °[facility] °[address] °[place], [place] [address] °[phone] ° °TOTAL KNEE REPLACEMENT POSTOPERATIVE DIRECTIONS ° ° ° °Knee Rehabilitation, Guidelines Following Surgery  °Results after knee surgery are often greatly improved when you follow the exercise, range of motion and muscle strengthening exercises prescribed by your doctor. Safety measures are also important to protect the knee from further injury. Any time any of these exercises cause you to have increased pain or swelling in your knee joint, decrease the amount until you are comfortable again and slowly increase them. If you have problems or questions, call your caregiver or physical therapist for advice.  ° °HOME CARE INSTRUCTIONS  °Remove items at home which could result in a fall. This includes throw rugs or furniture in walking pathways.  °Continue medications as instructed at time of discharge. °You may have some home medications which will be placed on hold until you complete the course of blood thinner medication.  °You may start showering once you are discharged home but do not submerge the incision under water. Just pat the incision dry and apply a dry gauze dressing on daily. °Walk with walker as instructed.  °You may resume a sexual relationship in one month or when given the OK by  your doctor.  °· Use walker as [place] as suggested by your caregivers. °· Avoid periods of inactivity such as sitting longer than an hour when not asleep. This helps prevent blood clots.  °You may put full weight on your legs and walk as much as is comfortable.  °You may return to work once you are cleared by your doctor.  °Do not drive a car for 6 weeks or until released by you surgeon.  °· Do not drive while taking narcotics.  °Wear the elastic stockings for three weeks following surgery during the day but you may remove then at night. °Make sure you keep all of your appointments after your  operation with all of your doctors and caregivers. You should call the office at the above phone number and make an appointment for approximately two weeks after the date of your surgery. °Change the dressing daily and reapply a dry dressing each time. °Please pick up a stool softener and laxative for home use as [place] as you are requiring pain medications. °· Continue to use ice on the knee for pain and swelling from surgery. You may notice swelling that will progress down to the foot and ankle.  This is normal after surgery.  Elevate the leg when you are not up walking on it.   °It is important for you to complete the blood thinner medication as prescribed by your doctor. °· Continue to use the breathing machine which will help keep your temperature down.  It is common for your temperature to cycle up and down following surgery, especially at night when you are not up moving around and exerting yourself.  The breathing machine keeps your lungs expanded and your temperature down. ° °RANGE OF MOTION AND STRENGTHENING EXERCISES  °Rehabilitation of the knee is important following a knee injury or an operation. After just a few days of immobilization, the muscles of the thigh which control the knee become weakened and shrink (atrophy). Knee exercises are designed to build up the tone and strength of the thigh muscles and to improve knee motion. Often times heat used for twenty to thirty minutes before working out will loosen up your tissues and help with improving the   range of motion but do not use heat for the first two weeks following surgery. These exercises can be done on a training (exercise) mat, on the floor, on a table or on a bed. Use what ever works the best and is most comfortable for you Knee exercises include:  Leg Lifts - While your knee is still immobilized in a splint or cast, you can do straight leg raises. Lift the leg to 60 degrees, hold for 3 sec, and slowly lower the leg. Repeat 10-20 times 2-3  times daily. Perform this exercise against resistance later as your knee gets better.  Quad and Hamstring Sets - Tighten up the muscle on the front of the thigh (Quad) and hold for 5-10 sec. Repeat this 10-20 times hourly. Hamstring sets are done by pushing the foot backward against an object and holding for 5-10 sec. Repeat as with quad sets.  A rehabilitation program following serious knee injuries can speed recovery and prevent re-injury in the future due to weakened muscles. Contact your doctor or a physical therapist for more information on knee rehabilitation.   SKILLED REHAB INSTRUCTIONS: If the patient is transferred to a skilled rehab facility following release from the hospital, a list of the current medications will be sent to the facility for the patient to continue.  When discharged from the skilled rehab facility, please have the facility set up the patient's [place] prior to being released. Also, the skilled facility will be responsible for providing the patient with their medications at time of release from the facility to include their pain medication, the muscle relaxants, and their blood thinner medication. If the patient is still at the rehab facility at time of the two week follow up appointment, the skilled rehab facility will also need to assist the patient in arranging follow up appointment in our office and any transportation needs.  MAKE SURE YOU:  Understand these instructions.  Will watch your condition.  Will get help right away if you are not doing well or get worse.    Pick up stool softner and laxative for home. Do not submerge incision under water. May shower. Continue to use ice for pain and swelling from surgery.             Information on my medicine - XARELTO (Rivaroxaban)  This medication education was reviewed with me or my healthcare representative as part of my discharge preparation.  The pharmacist that spoke with me  during my hospital stay was:  Kara Mead, [facility]  Why was Xarelto prescribed for you? Xarelto was prescribed for you to reduce the risk of blood clots forming after orthopedic surgery. The medical term for these abnormal blood clots is venous thromboembolism (VTE).  What do you need to know about xarelto ? Take your Xarelto ONCE DAILY at the same time every day. You may take it either with or without food.  If you have difficulty swallowing the tablet whole, you may crush it and mix in applesauce just prior to taking your dose.  Take Xarelto exactly as prescribed by your doctor and DO NOT stop taking Xarelto without talking to the doctor who prescribed the medication.  Stopping without other VTE prevention medication to take the place of Xarelto may increase your risk of developing a clot.  After discharge, you should have regular check-up appointments with your healthcare provider that is prescribing your Xarelto.    What do you do if you miss a dose? If you  you remember on the same day then continue your regularly scheduled once daily regimen the next day. Do not take two doses of Xarelto® on the same day.  ° °Important Safety Information °A possible side effect of Xarelto® is bleeding. You should call your healthcare provider right away if you experience any of the following: °  Bleeding from an injury or your nose that does not stop. °  Unusual colored urine (red or dark brown) or unusual colored stools (red or black). °  Unusual bruising for unknown reasons. °  A serious fall or if you hit your head (even if there is no bleeding). ° °Some medicines may interact with Xarelto® and might increase your risk of bleeding while on Xarelto®. To help avoid this, consult your healthcare provider or pharmacist prior to using any new prescription or non-prescription medications, including herbals, vitamins, non-steroidal  anti-inflammatory drugs (NSAIDs) and supplements. ° °This website has more information on Xarelto®: [email or web address].com. ° ° ° ° ° ° ° ° °

## 2014-03-11 LAB — CBC
HCT: 31.6 % — ABNORMAL LOW (ref 36.0–46.0)
Hemoglobin: 10.4 g/dL — ABNORMAL LOW (ref 12.0–15.0)
MCH: 31 pg (ref 26.0–34.0)
MCHC: 32.9 g/dL (ref 30.0–36.0)
MCV: 94 fL (ref 78.0–100.0)
PLATELETS: 195 10*3/uL (ref 150–400)
RBC: 3.36 MIL/uL — AB (ref 3.87–5.11)
RDW: 14 % (ref 11.5–15.5)
WBC: 14.2 10*3/uL — ABNORMAL HIGH (ref 4.0–10.5)

## 2014-03-11 LAB — BASIC METABOLIC PANEL
BUN: 10 mg/dL (ref 6–23)
CALCIUM: 8.6 mg/dL (ref 8.4–10.5)
CO2: 28 mEq/L (ref 19–32)
Chloride: 104 mEq/L (ref 96–112)
Creatinine, Ser: 0.62 mg/dL (ref 0.50–1.10)
GFR calc Af Amer: >90 mL/min (ref >90)
GFR calc non Af Amer: >90 mL/min (ref >90)
Glucose, Bld: 120 mg/dL — ABNORMAL HIGH (ref 70–99)
Potassium: 3.9 mEq/L (ref 3.7–5.3)
Sodium: 142 mEq/L (ref 137–147)

## 2014-03-11 NOTE — Progress Notes (Signed)
Occupational Therapy Treatment Patient Details Name: Stephanie Blake MRN: [id number] DOB: [birth year] Today's Date: 03/11/2014    History of present illness 58 yo female s/p R TKA 03/09/14.    OT comments  Pt overall at min to min guard assist level. She is planning d/c for today. Spouse able to assist PRN. Reviewed and practiced 3in1 transfers and educated and practiced shower stall transfers. Issued shower transfer handout.    Follow Up Recommendations  No OT follow up;Supervision/Assistance - 24 hour    Equipment Recommendations  None recommended by OT    Recommendations for Other Services       Precautions / Restrictions Precautions Precautions: Knee;Fall Required Braces or Orthoses: Knee Immobilizer - Right Knee Immobilizer - Right: Discontinue once straight leg raise with < 10 degree lag Restrictions Weight Bearing Restrictions: No RLE Weight Bearing: Weight bearing as tolerated       Mobility Bed Mobility   Bed Mobility: Supine to Sit     Supine to sit: Min assist     General bed mobility comments: assist for R LE  Transfers Overall transfer level: Needs assistance Equipment used: Rolling walker (2 wheeled) Transfers: Sit to/from Stand Sit to Stand: Min guard Stand pivot transfers: Min guard       General transfer comment: verbal cues for hand placement.    Balance                                   ADL                           Toilet Transfer: Min guard;Ambulation;RW   Toileting- Clothing Manipulation and Hygiene: Minimal assistance;Sit to/from stand   Tub/ Shower Transfer: Minimal assistance;Walk-in shower;Rolling walker     General ADL Comments: Educated pt and spouse on shower transfer and practiced with pt requiring min assist to steady. Discussed at length placement of 3in1 as shower chair as her walker wont fit through the shower opening. Issued shower transfer handout and reviewed information again with pt and spouse.  Pt doing well but appears anxious at times. Needed min cues to slow down at times and to make turns slower. She needed a reminder for hand placement with 3in1. Pt states she feels fatigued after morning therapy session including stair training and shower transfer. Discussed possibly waiting a day or so more before she does a shower and definitely using the 3in1 to sit down when she does shower. Explained it would be good for her to build a little more strength and activity tolerance so she doesn't feel fatigued with doing a shower. Pt agreeable. Discussed KI wear and how to don/doff and that is she is still needing to wear the KI, that she would have to wear to step into the shower, take it off to shower and don again to step out of the shower. Explained that HHPT would advise her as to when she doesn't have to wear KI anymore.      Vision                     Perception     Praxis      Cognition   Behavior During Therapy: [facility] for tasks assessed/performed Overall Cognitive Status: Within Functional Limits for tasks assessed  Extremity/Trunk Assessment               Exercises    Shoulder Instructions       General Comments      Pertinent Vitals/ Pain       Pt states "sore" R knee; reposition, ice  Home Living                                          Prior Functioning/Environment              Frequency Min 2X/week     Progress Toward Goals  OT Goals(current goals can now be found in the care plan section)  Progress towards OT goals: Progressing toward goals     Plan Discharge plan remains appropriate    Co-evaluation                 End of Session Equipment Utilized During Treatment: Gait belt;Rolling walker;Right knee immobilizer   Activity Tolerance Patient tolerated treatment well   Patient Left in chair;with call bell/phone within reach;with family/visitor present   Nurse Communication           Time: 6168-3729 OT Time Calculation (min): 20 min  Charges: OT General Charges $OT Visit: 1 Procedure OT Treatments $Therapeutic Activity: 8-22 mins  Jules Schick [phone] 03/11/2014, 11:18 AM

## 2014-03-11 NOTE — Progress Notes (Signed)
   Subjective: 2 Days Post-Op Procedure(s) (LRB): RIGHT TOTAL KNEE ARTHROPLASTY, INJECTION LEFT KNEE (Right) Patient reports pain as mild.   Patient seen in rounds with Dr. Wynelle Link. Patient is well, and has had no acute complaints or problems. She reports that she had a lot of pain during therapy but feels that she is progressing well. No issues overnight. No SOB or chest pain.  Plan is to go Home after hospital stay.  Objective: Vital signs in last 24 hours: Temp:  [98.2 F (36.8 C)-98.8 F (37.1 C)] 98.8 F (37.1 C) (06/10 0532) Pulse Rate:  [60-81] 81 (06/10 0532) Resp:  [16-18] 18 (06/10 0532) BP: (94-125)/(61-73) 112/65 mmHg (06/10 0532) SpO2:  [93 %-98 %] 96 % (06/10 0532)  Intake/Output from previous day:  Intake/Output Summary (Last 24 hours) at 03/11/14 0750 Last data filed at 03/11/14 0200  Gross per 24 hour  Intake 1539.42 ml  Output    650 ml  Net 889.42 ml    Intake/Output this shift:    Labs:  Recent Labs  03/10/14 0428 03/11/14 0408  HGB 10.8* 10.4*    Recent Labs  03/10/14 0428 03/11/14 0408  WBC 13.4* 14.2*  RBC 3.43* 3.36*  HCT 31.9* 31.6*  PLT 210 195    Recent Labs  03/10/14 0428 03/11/14 0408  NA 140 142  K 3.9 3.9  CL 106 104  CO2 27 28  BUN 13 10  CREATININE 0.74 0.62  GLUCOSE 165* 120*  CALCIUM 8.5 8.6   No results found for this basename: LABPT, INR,  in the last 72 hours  EXAM General - Patient is Alert and Oriented Extremity - Neurologically intact Intact pulses distally Dorsiflexion/Plantar flexion intact Compartment soft Dressing/Incision - clean, dry, no drainage Motor Function - intact, moving foot and toes well on exam.   Past Medical History  Diagnosis Date  . Hyperlipidemia   . Arthritis     osteoarthritis- knees    Assessment/Plan: 2 Days Post-Op Procedure(s) (LRB): RIGHT TOTAL KNEE ARTHROPLASTY, INJECTION LEFT KNEE (Right) Active Problems:   OA (osteoarthritis) of knee  Estimated body mass  index is 32.16 kg/(m^2) as calculated from the following:   Height as of this encounter: 5\' 7"  (1.702 m).   Weight as of this encounter: 93.157 kg (205 lb 6 oz). Advance diet Up with therapy D/C IV fluids Discharge home with home health  DVT Prophylaxis - Xarelto Weight-Bearing as tolerated    Doing well. Home after a session of PT today. Discharge instructions given.  Ardeen Jourdain, PA-C Orthopaedic Surgery 03/11/2014, 7:50 AM

## 2014-03-11 NOTE — Progress Notes (Signed)
Physical Therapy Treatment Patient Details Name: Stephanie Blake MRN: [id number] DOB: Jul 27, 1956 Today's Date: 03/11/2014    History of Present Illness 58 yo female s/p R TKA 03/09/14.     PT Comments    Progressing well with mobility. Practiced ambulation, exercises, stair ambulation. All education completed. Discussed car transfer.  Ready to d/c from PT standpoint.   Follow Up Recommendations  Home health PT;Supervision for mobility/OOB     Equipment Recommendations  Rolling walker with 5" wheels    Recommendations for Other Services OT consult     Precautions / Restrictions Precautions Precautions: Knee;Fall Required Braces or Orthoses: Knee Immobilizer - Right Knee Immobilizer - Right: Discontinue once straight leg raise with < 10 degree lag Restrictions Weight Bearing Restrictions: No RLE Weight Bearing: Weight bearing as tolerated    Mobility  Bed Mobility   Bed Mobility: Supine to Sit     Supine to sit: Min assist     General bed mobility comments: assist for R LE  Transfers Overall transfer level: Needs assistance Equipment used: Rolling walker (2 wheeled) Transfers: Sit to/from Stand Sit to Stand: Min guard Stand pivot transfers: Min guard       General transfer comment: verbal cues for hand placement,LE management. close guard for safety  Ambulation/Gait Ambulation/Gait assistance: Min guard Ambulation Distance (Feet): 105 Feet Assistive device: Rolling walker (2 wheeled) Gait Pattern/deviations: Step-to pattern;Step-through pattern;Decreased stride length;Antalgic     General Gait Details: close guard for safety. tolerated well.    Stairs Stairs: Yes Stairs assistance: Min guard Stair Management: One rail Left;With crutches;Forwards;Step to pattern Number of Stairs: 2 (x2) General stair comments: VCS safety, technique, sequence. Husband present. Practiced x 2 for pt/husband to master sequence. Handout issued as well.   Wheelchair Mobility     Modified Rankin (Stroke Patients Only)       Balance                                    Cognition Arousal/Alertness: Awake/alert Behavior During Therapy: WFL for tasks assessed/performed Overall Cognitive Status: Within Functional Limits for tasks assessed                      Exercises Total Joint Exercises Ankle Circles/Pumps: AROM;Both;10 reps;Supine Quad Sets: AROM;Both;10 reps;Supine Heel Slides: AAROM;Right;10 reps;Supine Hip ABduction/ADduction: AAROM;Right;10 reps;Supine Straight Leg Raises: AAROM;Right;10 reps;Supine Goniometric ROM: 10-45 degrees    General Comments        Pertinent Vitals/Pain 5/10 r knee with activity. Ice applied end of session    Home Living                      Prior Function            PT Goals (current goals can now be found in the care plan section) Progress towards PT goals: Progressing toward goals    Frequency  7X/week    PT Plan Current plan remains appropriate    Co-evaluation             End of Session Equipment Utilized During Treatment: Gait belt Activity Tolerance: Patient tolerated treatment well Patient left: in bed;with call bell/phone within reach;with family/visitor present     Time: 1443-1540 PT Time Calculation (min): 36 min  Charges:  $Gait Training: 8-22 mins $Therapeutic Exercise: 8-22 mins  G Codes:      Weston Anna, MPT Pager: [phone]

## 2014-03-13 NOTE — Discharge Summary (Signed)
Physician Discharge Summary   Patient ID: Stephanie Blake MRN: [id number] DOB/AGE: December 05, 1955 58 y.o.  Admit date: 03/09/2014 Discharge date: 03/11/2014  Primary Diagnosis: Osteoarthritis,  Bilateral knee  Admission Diagnoses:  Past Medical History  Diagnosis Date  . Hyperlipidemia   . Arthritis     osteoarthritis- knees   Discharge Diagnoses:   Active Problems:   OA (osteoarthritis) of knee  Estimated body mass index is 32.16 kg/(m^2) as calculated from the following:   Height as of this encounter: '5\' 7"'  (1.702 m).   Weight as of this encounter: 93.157 kg (205 lb 6 oz).  Procedure:  Procedure(s) (LRB): RIGHT TOTAL KNEE ARTHROPLASTY, INJECTION LEFT KNEE (Right)   Consults: None  HPI: Stephanie Blake, 58 y.o. female, has a history of pain and functional disability in the right knee due to arthritis and has failed non-surgical conservative treatments for greater than 12 weeks to includeNSAID's and/or analgesics, corticosteriod injections, viscosupplementation injections, flexibility and strengthening excercises and activity modification. Onset of symptoms was gradual, starting 5 years ago with gradually worsening course since that time. The patient noted no past surgery on the right knee(s). Patient currently rates pain in the right knee(s) at 7 out of 10 with activity. Patient has night pain, worsening of pain with activity and weight bearing, pain that interferes with activities of daily living, pain with passive range of motion, crepitus and joint swelling. Patient has evidence of periarticular osteophytes and joint space narrowing by imaging studies. There is no active infection.   Laboratory Data: Admission on 03/09/2014, Discharged on 03/11/2014  Component Date Value Ref Range Status  . ABO/RH(D) 03/09/2014 A POS   Final  . Antibody Screen 03/09/2014 NEG   Final  . Sample Expiration 03/09/2014 03/12/2014   Final  . ABO/RH(D) 03/09/2014 A POS   Final  . WBC 03/10/2014 13.4* 4.0 -  10.5 K/uL Final  . RBC 03/10/2014 3.43* 3.87 - 5.11 MIL/uL Final  . Hemoglobin 03/10/2014 10.8* 12.0 - 15.0 g/dL Final  . HCT 03/10/2014 31.9* 36.0 - 46.0 % Final  . MCV 03/10/2014 93.0  78.0 - 100.0 fL Final  . MCH 03/10/2014 31.5  26.0 - 34.0 pg Final  . MCHC 03/10/2014 33.9  30.0 - 36.0 g/dL Final  . RDW 03/10/2014 14.1  11.5 - 15.5 % Final  . Platelets 03/10/2014 210  150 - 400 K/uL Final  . Sodium 03/10/2014 140  137 - 147 mEq/L Final  . Potassium 03/10/2014 3.9  3.7 - 5.3 mEq/L Final  . Chloride 03/10/2014 106  96 - 112 mEq/L Final  . CO2 03/10/2014 27  19 - 32 mEq/L Final  . Glucose, Bld 03/10/2014 165* 70 - 99 mg/dL Final  . BUN 03/10/2014 13  6 - 23 mg/dL Final  . Creatinine, Ser 03/10/2014 0.74  0.50 - 1.10 mg/dL Final  . Calcium 03/10/2014 8.5  8.4 - 10.5 mg/dL Final  . GFR calc non Af Amer 03/10/2014 >90  >90 mL/min Final  . GFR calc Af Amer 03/10/2014 >90  >90 mL/min Final   Comment: (NOTE)                          The eGFR has been calculated using the CKD EPI equation.                          This calculation has not been validated in all clinical situations.  eGFR's persistently <90 mL/min signify possible Chronic Kidney                          Disease.  . WBC 03/11/2014 14.2* 4.0 - 10.5 K/uL Final  . RBC 03/11/2014 3.36* 3.87 - 5.11 MIL/uL Final  . Hemoglobin 03/11/2014 10.4* 12.0 - 15.0 g/dL Final  . HCT 03/11/2014 31.6* 36.0 - 46.0 % Final  . MCV 03/11/2014 94.0  78.0 - 100.0 fL Final  . MCH 03/11/2014 31.0  26.0 - 34.0 pg Final  . MCHC 03/11/2014 32.9  30.0 - 36.0 g/dL Final  . RDW 03/11/2014 14.0  11.5 - 15.5 % Final  . Platelets 03/11/2014 195  150 - 400 K/uL Final  . Sodium 03/11/2014 142  137 - 147 mEq/L Final  . Potassium 03/11/2014 3.9  3.7 - 5.3 mEq/L Final  . Chloride 03/11/2014 104  96 - 112 mEq/L Final  . CO2 03/11/2014 28  19 - 32 mEq/L Final  . Glucose, Bld 03/11/2014 120* 70 - 99 mg/dL Final  . BUN 03/11/2014 10  6 - 23  mg/dL Final  . Creatinine, Ser 03/11/2014 0.62  0.50 - 1.10 mg/dL Final  . Calcium 03/11/2014 8.6  8.4 - 10.5 mg/dL Final  . GFR calc non Af Amer 03/11/2014 >90  >90 mL/min Final  . GFR calc Af Amer 03/11/2014 >90  >90 mL/min Final   Comment: (NOTE)                          The eGFR has been calculated using the CKD EPI equation.                          This calculation has not been validated in all clinical situations.                          eGFR's persistently <90 mL/min signify possible Chronic Kidney                          Disease.  Hospital Outpatient Visit on 03/02/2014  Component Date Value Ref Range Status  . MRSA, PCR 03/02/2014 NEGATIVE  NEGATIVE Final  . Staphylococcus aureus 03/02/2014 NEGATIVE  NEGATIVE Final   Comment:                                 The Xpert SA Assay (FDA                          approved for NASAL specimens                          in patients over 56 years of age),                          is one component of                          a comprehensive surveillance                          program.  Test performance has  been validated by [facility] for patients greater                          than or equal to 74 year old.                          It is not intended                          to diagnose infection nor to                          guide or monitor treatment.  Marland Kitchen aPTT 03/02/2014 28  24 - 37 seconds Final  . WBC 03/02/2014 5.7  4.0 - 10.5 K/uL Final  . RBC 03/02/2014 4.36  3.87 - 5.11 MIL/uL Final  . Hemoglobin 03/02/2014 13.6  12.0 - 15.0 g/dL Final  . HCT 03/02/2014 40.2  36.0 - 46.0 % Final  . MCV 03/02/2014 92.2  78.0 - 100.0 fL Final  . MCH 03/02/2014 31.2  26.0 - 34.0 pg Final  . MCHC 03/02/2014 33.8  30.0 - 36.0 g/dL Final  . RDW 03/02/2014 13.9  11.5 - 15.5 % Final  . Platelets 03/02/2014 240  150 - 400 K/uL Final  . Sodium 03/02/2014 142  137 - 147 mEq/L Final  . Potassium  03/02/2014 4.8  3.7 - 5.3 mEq/L Final  . Chloride 03/02/2014 105  96 - 112 mEq/L Final  . CO2 03/02/2014 27  19 - 32 mEq/L Final  . Glucose, Bld 03/02/2014 89  70 - 99 mg/dL Final  . BUN 03/02/2014 12  6 - 23 mg/dL Final  . Creatinine, Ser 03/02/2014 0.66  0.50 - 1.10 mg/dL Final  . Calcium 03/02/2014 9.1  8.4 - 10.5 mg/dL Final  . Total Protein 03/02/2014 7.1  6.0 - 8.3 g/dL Final  . Albumin 03/02/2014 3.9  3.5 - 5.2 g/dL Final  . AST 03/02/2014 17  0 - 37 U/L Final  . ALT 03/02/2014 17  0 - 35 U/L Final  . Alkaline Phosphatase 03/02/2014 55  39 - 117 U/L Final  . Total Bilirubin 03/02/2014 0.3  0.3 - 1.2 mg/dL Final  . GFR calc non Af Amer 03/02/2014 >90  >90 mL/min Final  . GFR calc Af Amer 03/02/2014 >90  >90 mL/min Final   Comment: (NOTE)                          The eGFR has been calculated using the CKD EPI equation.                          This calculation has not been validated in all clinical situations.                          eGFR's persistently <90 mL/min signify possible Chronic Kidney                          Disease.  Marland Kitchen Prothrombin Time 03/02/2014 12.1  11.6 - 15.2 seconds Final  . INR 03/02/2014 0.91  0.00 -  1.49 Final  . Color, Urine 03/02/2014 YELLOW  YELLOW Final  . APPearance 03/02/2014 CLEAR  CLEAR Final  . Specific Gravity, Urine 03/02/2014 1.007  1.005 - 1.030 Final  . pH 03/02/2014 7.5  5.0 - 8.0 Final  . Glucose, UA 03/02/2014 NEGATIVE  NEGATIVE mg/dL Final  . Hgb urine dipstick 03/02/2014 NEGATIVE  NEGATIVE Final  . Bilirubin Urine 03/02/2014 NEGATIVE  NEGATIVE Final  . Ketones, ur 03/02/2014 NEGATIVE  NEGATIVE mg/dL Final  . Protein, ur 03/02/2014 NEGATIVE  NEGATIVE mg/dL Final  . Urobilinogen, UA 03/02/2014 0.2  0.0 - 1.0 mg/dL Final  . Nitrite 03/02/2014 NEGATIVE  NEGATIVE Final  . Leukocytes, UA 03/02/2014 NEGATIVE  NEGATIVE Final   MICROSCOPIC NOT DONE ON URINES WITH NEGATIVE PROTEIN, BLOOD, LEUKOCYTES, NITRITE, OR GLUCOSE <1000 mg/dL.      X-Rays:No results found.  EKG: Orders placed in visit on 12/07/10  . CONVERTED CEMR EKG     Hospital Course: DJENEBA BARSCH is a 58 y.o. who was admitted to [facility]. They were brought to the operating room on 03/09/2014 and underwent Procedure(s): RIGHT TOTAL KNEE ARTHROPLASTY, INJECTION LEFT KNEE.  Patient tolerated the procedure well and was later transferred to the recovery room and then to the orthopaedic floor for postoperative care.  They were given PO and IV analgesics for pain control following their surgery.  They were given 24 hours of postoperative antibiotics of  Anti-infectives   Start     Dose/Rate Route Frequency Ordered Stop   03/09/14 1600  ceFAZolin (ANCEF) IVPB 2 g/50 mL premix     2 g 100 mL/hr over 30 Minutes Intravenous Every 6 hours 03/09/14 1012 03/09/14 2248   03/09/14 0553  ceFAZolin (ANCEF) IVPB 2 g/50 mL premix     2 g 100 mL/hr over 30 Minutes Intravenous On call to O.R. 03/09/14 7106 03/09/14 0725     and started on DVT prophylaxis in the form of Xarelto.   PT and OT were ordered for total joint protocol.  Discharge planning consulted to help with postop disposition and equipment needs.  Patient had a good night on the evening of surgery.  They started to get up OOB with therapy on day one. Hemovac drain was pulled without difficulty.  Continued to work with therapy into day two.  Dressing was changed on day two and the incision was clean and dry.  Patient had progressed with therapy and meeting their goals.  Incision was healing well.  Patient was seen in rounds and was ready to go home.   Diet: Regular diet Activity:WBAT Follow-up:in 2 weeks Disposition - Home Discharged Condition: stable   Discharge Instructions   Call MD / Call 911    Complete by:  As directed   If you experience chest pain or shortness of breath, CALL 911 and be transported to the hospital emergency room.  If you develope a fever above 101 F, pus (white drainage) or  increased drainage or redness at the wound, or calf pain, call your surgeon's office.     Change dressing    Complete by:  As directed   Change dressing daily with sterile 4 x 4 inch gauze dressing and apply TED hose.     Constipation Prevention    Complete by:  As directed   Drink plenty of fluids.  Prune juice may be helpful.  You may use a stool softener, such as Colace (over the counter) 100 mg twice a day.  Use MiraLax (over the counter)  for constipation as needed.     Diet general    Complete by:  As directed      Discharge instructions    Complete by:  As directed   Walk with your walker. Weight bearing as tolerated [facility] will follow you at home for your therapy  Change your dressing daily. Shower only, no tub bath. Call if any temperatures greater than 101 or any wound complications: [phone]     Do not put a pillow under the knee. Place it under the heel.    Complete by:  As directed      Driving restrictions    Complete by:  As directed   No driving     Increase activity slowly as tolerated    Complete by:  As directed      TED hose    Complete by:  As directed   Use stockings (TED hose) for 3 weeks on both leg(s).  You may remove them at night for sleeping.            Medication List    STOP taking these medications       glucosamine-chondroitin 500-400 MG tablet     naproxen sodium 220 MG tablet  Commonly known as:  ANAPROX      TAKE these medications       DSS 100 MG Caps  Take 100 mg by mouth 2 (two) times daily.     eszopiclone 3 MG Tabs  Generic drug:  Eszopiclone  Take 3 mg by mouth at bedtime. Take immediately before bedtime     methocarbamol 500 MG tablet  Commonly known as:  ROBAXIN  Take 1 tablet (500 mg total) by mouth every 6 (six) hours as needed for muscle spasms.     oxyCODONE 5 MG immediate release tablet  Commonly known as:  Oxy IR/ROXICODONE  Take 1-2 tablets (5-10 mg total) by mouth every 3 (three) hours as needed for  breakthrough pain.     rivaroxaban 10 MG Tabs tablet  Commonly known as:  XARELTO  Take 1 tablet (10 mg total) by mouth daily with breakfast.     traMADol 50 MG tablet  Commonly known as:  ULTRAM  Take 1-2 tablets (50-100 mg total) by mouth every 6 (six) hours as needed for moderate pain.           Follow-up Information   Follow up with Gearlean Alf, MD. Schedule an appointment as soon as possible for a visit in 2 weeks.   Specialty:  Orthopedic Surgery   Contact information:   [address] [place] [address] [phone]       Signed: Ardeen Jourdain, PA-C Orthopaedic Surgery 03/13/2014, 9:22 AM

## 2014-06-19 ENCOUNTER — Telehealth: Payer: Self-pay | Admitting: Internal Medicine

## 2014-06-19 NOTE — Telephone Encounter (Signed)
Got up off of sofa and pulled muscles in back.  She seen Dr. Estill Batten in regards.  She has had an MRI and she was told to get in touch with her PCP to get a prednizone dose pack.  She would like to know if she should come in to see Dr. Ronnald Ramp if he could do this or does she need to see Dr. Tamala Julian.

## 2014-06-19 NOTE — Telephone Encounter (Signed)
Got scheduled at [facility].

## 2014-06-19 NOTE — Telephone Encounter (Signed)
She should see Dr. Tamala Julian

## 2014-06-19 NOTE — Telephone Encounter (Signed)
She can be scheduled with Dr. Tamala Julian one day next week.

## 2014-06-20 ENCOUNTER — Ambulatory Visit (INDEPENDENT_AMBULATORY_CARE_PROVIDER_SITE_OTHER): Payer: BC Managed Care – PPO | Admitting: Family Medicine

## 2014-06-20 ENCOUNTER — Encounter: Payer: Self-pay | Admitting: Family Medicine

## 2014-06-20 ENCOUNTER — Ambulatory Visit: Payer: BC Managed Care – PPO | Admitting: Family Medicine

## 2014-06-20 VITALS — BP 102/80 | Temp 98.2°F | Wt 194.5 lb

## 2014-06-20 DIAGNOSIS — M5416 Radiculopathy, lumbar region: Secondary | ICD-10-CM

## 2014-06-20 DIAGNOSIS — IMO0002 Reserved for concepts with insufficient information to code with codable children: Secondary | ICD-10-CM

## 2014-06-20 MED ORDER — PREDNISONE 10 MG PO TABS: ORAL_TABLET | ORAL | Status: DC

## 2014-06-20 NOTE — Assessment & Plan Note (Signed)
Per MRI she has disc protrusion and lumbar spinal stenosis She has only seen chiropractor so far No imp with otc nsaid Will try a low dose prednisone taper  Recommend-intermittent heat and ice  Pt will update PCP next week and likely f/u with sport med or request a ref to orthopedics  Update if not starting to improve in a week or if worsening

## 2014-06-20 NOTE — Patient Instructions (Signed)
It sounds like you have some disc issues and spinal stenosis  Take the prednisone taper as directed  Use heat or ice if it helps Let your primary care doctor know how you are doing next week

## 2014-06-20 NOTE — Progress Notes (Signed)
Subjective:    Patient ID: Stephanie Blake, female    DOB: July 30, 1956, 58 y.o.   MRN: [id number]  HPI Here for low back and R leg pain   Had a knee replacement in June -did great with that  After that while on vacation developed severe back pain  Went to chiropractor  Then developed pain rad down R leg  She had an MRI on Thursday night  Disc dessication and facet arthrosis , disc protrusion at L2-L3 Some stenosis from L3 to S1  Pain is severe  Have not set up specialist appt yet   Prednisone was suggested to her  She has been taking nsaid otc-no help so she stopped   No weakness/numbness or tingling   Patient Active Problem List   Diagnosis Date Noted  . OA (osteoarthritis) of knee 03/09/2014  . Routine general medical examination at a health care facility 11/12/2013  . DJD (degenerative joint disease) of knee 12/07/2010  . HYPERLIPIDEMIA 01/03/2008  . Overweight 01/03/2008   Past Medical History  Diagnosis Date  . Hyperlipidemia   . Arthritis     osteoarthritis- knees   Past Surgical History  Procedure Laterality Date  . Dilation and curettage of uterus      s/p miscarriage-30 yrs ago  . Tonsillectomy      childhood  . Total knee arthroplasty Right 03/09/2014    Procedure: RIGHT TOTAL KNEE ARTHROPLASTY, INJECTION LEFT KNEE;  Surgeon: Gearlean Alf, MD;  Location: [facility];  Service: Orthopedics;  Laterality: Right;   History  Substance Use Topics  . Smoking status: Former Research scientist (life sciences)  . Smokeless tobacco: Former Systems developer    Quit date: 11/02/2013  . Alcohol Use: Yes     Comment: wine occ. rare   Family History  Problem Relation Age of Onset  . Breast cancer Mother   . Alcohol abuse Father   . Cancer Neg Hx   . Diabetes Neg Hx   . Early death Neg Hx   . Heart disease Neg Hx   . Hyperlipidemia Neg Hx   . Stroke Neg Hx    Allergies  Allergen Reactions  . Crestor [Rosuvastatin]     Muscle aches   Current Outpatient Prescriptions on File Prior to Visit    Medication Sig Dispense Refill  . Eszopiclone (ESZOPICLONE) 3 MG TABS Take 3 mg by mouth at bedtime. Take immediately before bedtime       No current facility-administered medications on file prior to visit.     Review of Systems Review of Systems  Constitutional: Negative for fever, appetite change, fatigue and unexpected weight change.  Eyes: Negative for pain and visual disturbance.  Respiratory: Negative for cough and shortness of breath.   Cardiovascular: Negative for cp or palpitations    Gastrointestinal: Negative for nausea, diarrhea and constipation.  Genitourinary: Negative for urgency and frequency.  Skin: Negative for pallor or rash  MSK pos for back pain rad to her R leg   Neurological: Negative for weakness, light-headedness, numbness and headaches.  Hematological: Negative for adenopathy. Does not bruise/bleed easily.  Psychiatric/Behavioral: Negative for dysphoric mood. The patient is not nervous/anxious.         Objective:   Physical Exam  Constitutional: She appears well-developed and well-nourished. No distress.  obese and well appearing   Neck: Normal range of motion. Neck supple.  Cardiovascular: Normal rate, regular rhythm and normal heart sounds.   Pulmonary/Chest: Effort normal and breath sounds normal.  Musculoskeletal: She exhibits tenderness. She  exhibits no edema.       Lumbar back: She exhibits decreased range of motion, tenderness and pain. She exhibits no bony tenderness, no swelling, no edema and no laceration.  Tender in R para lumbar musculature  No piriformis tenderness Nl rom hip  Pain on R lateral flexion of spine   Neg bent knee raise bilat   No neuro s/s    Neurological: She is alert. She has normal strength and normal reflexes. She displays no atrophy and normal reflexes. No sensory deficit. She exhibits normal muscle tone. Coordination and gait normal.  Skin: Skin is warm and dry. No rash noted. No erythema. No pallor.   Psychiatric: She has a normal mood and affect.          Assessment & Plan:   Problem List Items Addressed This Visit     Nervous and Auditory   Lumbar back pain with radiculopathy affecting right lower extremity - Primary     Per MRI she has disc protrusion and lumbar spinal stenosis She has only seen chiropractor so far No imp with otc nsaid Will try a low dose prednisone taper  Recommend-intermittent heat and ice  Pt will update PCP next week and likely f/u with sport med or request a ref to orthopedics  Update if not starting to improve in a week or if worsening      Relevant Medications      predniSONE (DELTASONE) tablet

## 2014-06-24 ENCOUNTER — Encounter: Payer: Self-pay | Admitting: *Deleted

## 2014-06-24 ENCOUNTER — Encounter: Payer: Self-pay | Admitting: Family Medicine

## 2014-06-24 ENCOUNTER — Ambulatory Visit (INDEPENDENT_AMBULATORY_CARE_PROVIDER_SITE_OTHER): Payer: BC Managed Care – PPO | Admitting: Family Medicine

## 2014-06-24 VITALS — BP 120/82 | HR 106 | Ht 66.0 in | Wt 196.0 lb

## 2014-06-24 DIAGNOSIS — IMO0002 Reserved for concepts with insufficient information to code with codable children: Secondary | ICD-10-CM

## 2014-06-24 DIAGNOSIS — M5416 Radiculopathy, lumbar region: Secondary | ICD-10-CM

## 2014-06-24 MED ORDER — GABAPENTIN 300 MG PO CAPS: ORAL_CAPSULE | ORAL | Status: DC

## 2014-06-24 MED ORDER — KETOROLAC TROMETHAMINE 60 MG/2ML IM SOLN
60.0000 mg | Freq: Once | INTRAMUSCULAR | Status: AC
  Administered 2014-06-24: 60 mg via INTRAMUSCULAR

## 2014-06-24 NOTE — Addendum Note (Signed)
Addended by: Douglass Rivers T on: 06/24/2014 01:08 PM   Modules accepted: Orders

## 2014-06-24 NOTE — Progress Notes (Signed)
  [name] [address] [place], [place] [address] Phone: [phone] Subjective:    I'm seeing this patient by the request  of:  Scarlette Calico, MD   CC: Back pain with radiation.   Stephanie Blake is a 58 y.o. female coming in with complaint of low back pain. Patient states that she has had this pain for some time. Patient has tried different modalities including going to a chiropractor without any significant benefit. Patient states that this pain seems to be worsening. Patient states that the pain is traveling down her right leg. He patient did have an MRI last week. Patient states that the MRI did show that patient had a disc protrusion as well as facet arthrosis with disc protrusion at the L2-L3 level as well as spinal stenosis from L3-S1. Patient has tried over-the-counter anti-inflammatories with no significant improvement. Patient did see Dr. Glori Bickers last week and was given a prednisone taper. States that this may be helping minimally. Patient states he continues to have the pain going over the anterior aspect of her thigh. States that it is a burning sensation. Denies any weakness numbness or any bowel or bladder incontinence.      Past medical history, social, surgical and family history all reviewed in electronic medical record.   Review of Systems: No headache, visual changes, nausea, vomiting, diarrhea, constipation, dizziness, abdominal pain, skin rash, fevers, chills, night sweats, weight loss, swollen lymph nodes, body aches, joint swelling, muscle aches, chest pain, shortness of breath, mood changes.   Objective Blood pressure 120/82, pulse 106, height 5\' 6"  (1.676 m), weight 196 lb (88.905 kg), SpO2 96.00%.  General: No apparent distress alert and oriented x3 mood and affect normal, dressed appropriately.  HEENT: Pupils equal, extraocular movements intact  Respiratory: Patient's speak in full sentences and does not appear short of  breath  Cardiovascular: No lower extremity edema, non tender, no erythema  Skin: Warm dry intact with no signs of infection or rash on extremities or on axial skeleton.  Abdomen: Soft nontender  Neuro: Cranial nerves II through XII are intact, neurovascularly intact in all extremities with 2+ DTRs and 2+ pulses.  Lymph: No lymphadenopathy of posterior or anterior cervical chain or axillae bilaterally.  Gait patient's gait shows the patient is an antalgic swinging patient's right hip moderately.  MSK:  Non tender with full range of motion and good stability and symmetric strength and tone of shoulders, elbows, wrist, hip, knee and ankles bilaterally. 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 Back Exam:  Inspection: Unremarkable  Motion: Flexion 35 deg, Extension 25 deg, Side Bending to 25 deg bilaterally,  Rotation to 45 deg bilaterally  SLR laying: Negative  XSLR laying: Negative  Palpable tenderness: None. FABER: negative. Sensory change: Gross sensation intact to all lumbar and sacral dermatomes.  Reflexes: 2+ at both patellar tendons, 2+ at achilles tendons, Babinski's downgoing.  Strength at foot  Plantar-flexion: 5/5 Dorsi-flexion: 5/5 Eversion: 5/5 Inversion: 5/5  Leg strength  Quad: 5/5 Hamstring: 5/5 Hip flexor: 5/5 Hip abductors: 5/5        Impression and Recommendations:     This case required medical decision making of moderate complexity.

## 2014-06-24 NOTE — Patient Instructions (Addendum)
Good to meet you Ice 20 minutes 2 times daily. Usually after activity and before bed. Exercises 3 times a week.  Turmeric 500mg  twice daily Glucosamine if you want.  Gabapentin 300mg  at night. Remember will make you feel a little loopy maybe at first.  Light duty for next week.  Come back in 1 week and we will check how you are doing.

## 2014-06-24 NOTE — Assessment & Plan Note (Signed)
Patient's lumbar radiculopathy is consistent with her MRI findings of an L2-L3 disc protrusion giving some mild compression on the right sided L3 nerve root. Discuss different treatment options with patient at this time and she has elected to conservative therapy with her improving with the prednisone. At this point on physical exam patient has a negative straight leg test and full which makes me think she will respond well to conservative therapy. Patient given an icing regimen, home exercise program, as well as discussed limitations on lifting and rotation for the next week. Patient given gabapentin at night to see if that will relieve some of his discomfort. Patient and will follow up with me in one week. Depending on how patient is improving we'll either advance patient's job and increase her activity or we will consider epidural steroid injection and potentially formal physical therapy.

## 2014-06-29 ENCOUNTER — Ambulatory Visit (INDEPENDENT_AMBULATORY_CARE_PROVIDER_SITE_OTHER): Payer: BC Managed Care – PPO | Admitting: Family Medicine

## 2014-06-29 ENCOUNTER — Encounter: Payer: Self-pay | Admitting: Family Medicine

## 2014-06-29 ENCOUNTER — Ambulatory Visit (INDEPENDENT_AMBULATORY_CARE_PROVIDER_SITE_OTHER): Payer: BC Managed Care – PPO | Admitting: Internal Medicine

## 2014-06-29 VITALS — BP 114/72 | HR 83 | Temp 98.1°F

## 2014-06-29 DIAGNOSIS — M5416 Radiculopathy, lumbar region: Secondary | ICD-10-CM

## 2014-06-29 DIAGNOSIS — E663 Overweight: Secondary | ICD-10-CM

## 2014-06-29 DIAGNOSIS — IMO0002 Reserved for concepts with insufficient information to code with codable children: Secondary | ICD-10-CM

## 2014-06-29 NOTE — Progress Notes (Signed)
  [name] [address] [place], [place] [address] Phone: [phone] Subjective:     CC: Back pain with radiation followup  SJG:GEZMOQHUTM Stephanie Blake is a 58 y.o. female coming in with complaint of low back pain. Patient states that she has had this pain for some time. Patient has tried different modalities including going to a chiropractor without any significant benefit. Patient states that this pain seems to be worsening. Patient states that the pain is traveling down her right leg. He patient did have an MRI last week. Patient states that the MRI did show that patient had a disc protrusion as well as facet arthrosis with disc protrusion at the L2-L3 level as well as spinal stenosis from L3-S1. Marland Kitchen patient was given prednisone, exercises, icing protocol, as well as gabapentin at night. Patient states that she is approximately 50% better. States that the pain going down her leg is significantly better where she can work for 6 hours without difficulty. Patient is getting better sleep at night as well. Patient denies any new symptoms such as weakness or any bowel or bladder incontinence.     Past medical history, social, surgical and family history all reviewed in electronic medical record.   Review of Systems: No headache, visual changes, nausea, vomiting, diarrhea, constipation, dizziness, abdominal pain, skin rash, fevers, chills, night sweats, weight loss, swollen lymph nodes, body aches, joint swelling, muscle aches, chest pain, shortness of breath, mood changes.   Objective Blood pressure 114/72, pulse 83, temperature 98.1 F (36.7 C), temperature source Oral, SpO2 97.00%.  General: No apparent distress alert and oriented x3 mood and affect normal, dressed appropriately.  HEENT: Pupils equal, extraocular movements intact  Respiratory: Patient's speak in full sentences and does not appear short of breath  Cardiovascular: No lower extremity edema, non  tender, no erythema  Skin: Warm dry intact with no signs of infection or rash on extremities or on axial skeleton.  Abdomen: Soft nontender  Neuro: Cranial nerves II through XII are intact, neurovascularly intact in all extremities with 2+ DTRs and 2+ pulses.  Lymph: No lymphadenopathy of posterior or anterior cervical chain or axillae bilaterally.  Gait patient's gait shows the patient is an antalgic swinging patient's right hip moderately.  MSK:  Non tender with full range of motion and good stability and symmetric strength and tone of shoulders, elbows, wrist, hip, knee and ankles bilaterally. 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 Back Exam:  Inspection: Unremarkable  Motion: Flexion 35 deg, Extension 25 deg, Side Bending to 25 deg bilaterally,  Rotation to 45 deg bilaterally  SLR laying: Negative  XSLR laying: Negative  Palpable tenderness: None. FABER: negative. Sensory change: Gross sensation intact to all lumbar and sacral dermatomes.  Reflexes: 2+ at both patellar tendons, 2+ at achilles tendons, Babinski's downgoing.  Strength at foot  Plantar-flexion: 5/5 Dorsi-flexion: 5/5 Eversion: 5/5 Inversion: 5/5  Leg strength  Quad: 5/5 Hamstring: 5/5 Hip flexor: 5/5 Hip abductors: 4/5     Impression and Recommendations:     This case required medical decision making of moderate complexity.

## 2014-06-29 NOTE — Patient Instructions (Signed)
Continue to do what you are doing  OK to go back to the chiropractor but no significant HVLA.  Ice is still your friend.  Continue the gababpentin at night and the vitamin d and turmeric.  Come back in 2 weeks tom make sure you are doing near perfect

## 2014-06-29 NOTE — Assessment & Plan Note (Signed)
Improving with conservative therapy.  Discussed PT which patient declines.  Will continue HEP, will go back to chiropractor for u/s and non direct techniques we discussed. Patient is to make any significant improvement I would also consider possible epidural steroid injections. Patient will continue the over-the-counter medications at this time. Patient is still looking towards having a knee replacement on the left side. We'll continue to monitor closely. Patient will come back in 2 weeks after doing a trial full duty. We discussed what activities to avoid still.  Spent greater than 25 minutes with patient face-to-face and had greater than 50% of counseling including as described above in assessment and plan.

## 2014-06-29 NOTE — Progress Notes (Signed)
   Subjective:    Patient ID: Stephanie Blake, female    DOB: 1956/09/24, 58 y.o.   MRN: [id number]  HPI  Not seen  Review of Systems     Objective:   Physical Exam  Lab Results  Component Value Date   WBC 14.2* 03/11/2014   HGB 10.4* 03/11/2014   HCT 31.6* 03/11/2014   PLT 195 03/11/2014   GLUCOSE 120* 03/11/2014   CHOL 203* 01/03/2011   TRIG 106.0 01/03/2011   HDL 57.20 01/03/2011   LDLDIRECT 129.6 01/03/2011   ALT 17 03/02/2014   AST 17 03/02/2014   NA 142 03/11/2014   K 3.9 03/11/2014   CL 104 03/11/2014   CREATININE 0.62 03/11/2014   BUN 10 03/11/2014   CO2 28 03/11/2014   TSH 1.11 12/02/2010   INR 0.91 03/02/2014        Assessment & Plan:

## 2014-07-09 ENCOUNTER — Encounter: Payer: Self-pay | Admitting: Family Medicine

## 2014-07-09 ENCOUNTER — Other Ambulatory Visit: Payer: Self-pay | Admitting: Family Medicine

## 2014-07-10 MED ORDER — GABAPENTIN 300 MG PO CAPS: 300.0000 mg | ORAL_CAPSULE | Freq: Every day | ORAL | Status: DC

## 2014-07-13 ENCOUNTER — Ambulatory Visit (INDEPENDENT_AMBULATORY_CARE_PROVIDER_SITE_OTHER): Payer: BC Managed Care – PPO | Admitting: Family Medicine

## 2014-07-13 ENCOUNTER — Encounter: Payer: Self-pay | Admitting: Family Medicine

## 2014-07-13 VITALS — BP 138/84 | HR 99 | Ht 66.0 in | Wt 202.0 lb

## 2014-07-13 DIAGNOSIS — M5416 Radiculopathy, lumbar region: Secondary | ICD-10-CM

## 2014-07-13 DIAGNOSIS — M5417 Radiculopathy, lumbosacral region: Secondary | ICD-10-CM

## 2014-07-13 MED ORDER — TRAMADOL HCL 50 MG PO TABS: 50.0000 mg | ORAL_TABLET | Freq: Every evening | ORAL | Status: DC | PRN

## 2014-07-13 MED ORDER — GABAPENTIN 100 MG PO CAPS: 100.0000 mg | ORAL_CAPSULE | Freq: Two times a day (BID) | ORAL | Status: DC

## 2014-07-13 NOTE — Progress Notes (Signed)
  [facility] [place], [place] [address] Phone: [phone] Subjective:     CC: Back pain with radiation followup  BJY:NWGNFAOZHY Stephanie Blake is a 58 y.o. female coming in with complaint of low back pain. Patient states that she has had this pain for some time. Patient has tried different modalities including going to a chiropractor without any significant benefit. Patient states that this pain seems to be worsening. Patient states that the pain is traveling down her right leg. He patient did have an MRI last week. Patient states that the MRI did show that patient had a disc protrusion as well as facet arthrosis with disc protrusion at the L2-L3 level as well as spinal stenosis from L3-S1. Marland Kitchen patient was given prednisone, exercises, icing protocol, as well as gabapentin at night. Patient was doing somewhat better previously. Patient states now that she is starting have worsening pain again. Radicular symptoms are becoming worse. Denies any weakness but is finding it more difficult to ambulate regularly. Having pain at night. States that the gabapentin helps somewhat but not completely.     Past medical history, social, surgical and family history all reviewed in electronic medical record.   Review of Systems: No headache, visual changes, nausea, vomiting, diarrhea, constipation, dizziness, abdominal pain, skin rash, fevers, chills, night sweats, weight loss, swollen lymph nodes, body aches, joint swelling, muscle aches, chest pain, shortness of breath, mood changes.   Objective Blood pressure 138/84, pulse 99, height 5\' 6"  (1.676 m), weight 202 lb (91.627 kg), SpO2 97.00%.  General: No apparent distress alert and oriented x3 mood and affect normal, dressed appropriately.  HEENT: Pupils equal, extraocular movements intact  Respiratory: Patient's speak in full sentences and does not appear short of breath  Cardiovascular: No lower extremity edema, non  tender, no erythema  Skin: Warm dry intact with no signs of infection or rash on extremities or on axial skeleton.  Abdomen: Soft nontender  Neuro: Cranial nerves II through XII are intact, neurovascularly intact in all extremities with 2+ DTRs and 2+ pulses.  Lymph: No lymphadenopathy of posterior or anterior cervical chain or axillae bilaterally.  Gait patient's gait shows the patient is an antalgic swinging patient's right hip moderately.  MSK:  Non tender with full range of motion and good stability and symmetric strength and tone of shoulders, elbows, wrist, hip, knee and ankles bilaterally. 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 Back Exam:  Inspection: Unremarkable  Motion: Flexion 35 deg, Extension 25 deg, Side Bending to 25 deg bilaterally,  Rotation to 45 deg bilaterally  SLR laying: Negative  XSLR laying: Negative  Palpable tenderness: None. FABER: negative. Sensory change: Gross sensation intact to all lumbar and sacral dermatomes.  Reflexes: 2+ at both patellar tendons, 2+ at achilles tendons, Babinski's downgoing.  Strength at foot  Plantar-flexion: 5/5 Dorsi-flexion: 5/5 Eversion: 5/5 Inversion: 5/5  Leg strength  Quad: 5/5 Hamstring: 5/5 Hip flexor: 4/5 Hip abductors: 4/5  No significant change from previous exam   Impression and Recommendations:     This case required medical decision making of moderate complexity.

## 2014-07-13 NOTE — Assessment & Plan Note (Signed)
Discussed with patient at great length. Do feel that patient's spinal stenosis from L3-S1 as well as her L2-L3 facet arthropathy would be consistent with the pain that she is having at this time. I discussed with patient about different treatment options and patient has elected to go forward with an epidural steroid injection and would like referral to a neurosurgeon for further evaluation. Both of these orders were made the day. Patient likely will have some difficulty getting in with the neurosurgery so patient will come back within 2 weeks of having epidural steroid injection and see if she makes improvement. Patient will be put on gabapentin 100 mg during the day as well as afternoon and continue to 300 mg at night and tramadol for breakthrough pain. Patient and will followup with me again like I stated before after the epidural steroid injection. Patient had numerous questions were all answered. We did go for diagram of the back and her pathology.  Spent greater than 25 minutes with patient face-to-face and had greater than 50% of counseling including as described above in assessment and plan.

## 2014-07-13 NOTE — Patient Instructions (Addendum)
Good to see you We will get epidural  [address] or [facility] are my favorite then anything at [employer], [employer] or [facility].  Tramadol at night as needed.  Gabapentin at 100mg  twice daily and still 300mg  nightly.  Ice is your friend

## 2014-07-17 ENCOUNTER — Ambulatory Visit
Admission: RE | Admit: 2014-07-17 | Discharge: 2014-07-17 | Disposition: A | Discharge status: Home / Self Care | Payer: BC Managed Care – PPO | Source: Ambulatory Visit | Attending: Family Medicine | Admitting: Family Medicine

## 2014-07-17 DIAGNOSIS — M5416 Radiculopathy, lumbar region: Secondary | ICD-10-CM

## 2014-07-17 MED ORDER — IOHEXOL 180 MG/ML  SOLN
1.0000 mL | Freq: Once | INTRAMUSCULAR | Status: AC | PRN
  Administered 2014-07-17: 1 mL via INTRAVENOUS

## 2014-07-17 MED ORDER — METHYLPREDNISOLONE ACETATE 40 MG/ML INJ SUSP (RADIOLOG
120.0000 mg | Freq: Once | INTRAMUSCULAR | Status: AC
  Administered 2014-07-17: 120 mg via EPIDURAL

## 2014-07-17 NOTE — Discharge Instructions (Signed)

## 2014-07-20 ENCOUNTER — Encounter: Payer: Self-pay | Admitting: Family Medicine

## 2014-07-20 DIAGNOSIS — M5416 Radiculopathy, lumbar region: Secondary | ICD-10-CM

## 2014-07-20 NOTE — Telephone Encounter (Signed)
Referral entered  

## 2014-11-08 ENCOUNTER — Other Ambulatory Visit: Payer: Self-pay | Admitting: Family Medicine

## 2014-11-09 NOTE — Telephone Encounter (Signed)
Refill done.  

## 2015-02-25 ENCOUNTER — Other Ambulatory Visit: Payer: Self-pay

## 2015-02-25 DIAGNOSIS — Z1231 Encounter for screening mammogram for malignant neoplasm of breast: Secondary | ICD-10-CM

## 2015-03-24 ENCOUNTER — Ambulatory Visit
Admission: RE | Admit: 2015-03-24 | Discharge: 2015-03-24 | Disposition: A | Discharge status: Home / Self Care | Payer: BLUE CROSS/BLUE SHIELD | Source: Ambulatory Visit

## 2015-03-24 DIAGNOSIS — Z1231 Encounter for screening mammogram for malignant neoplasm of breast: Secondary | ICD-10-CM

## 2015-07-13 ENCOUNTER — Ambulatory Visit: Payer: Self-pay | Admitting: Orthopedic Surgery

## 2015-07-13 NOTE — Progress Notes (Signed)
Preoperative surgical orders have been place into the Epic hospital system for Stephanie Blake on 07/13/2015, 12:40 PM  by Mickel Crow for surgery on 08-02-2015.  Preop Total Knee orders including Experal, IV Tylenol, and IV Decadron as Malphrus as there are no contraindications to the above medications. Arlee Muslim, PA-C

## 2015-07-22 ENCOUNTER — Other Ambulatory Visit (HOSPITAL_COMMUNITY): Payer: Self-pay | Admitting: *Deleted

## 2015-07-22 NOTE — Patient Instructions (Addendum)
Stephanie Blake  07/22/2015   Your procedure is scheduled on: 08-02-15  Report to [facility] Main  Entrance take [facility]  elevators to 3rd floor to  [place] at 600 AM.  Call this number if you have problems the morning of surgery [phone]   Remember: ONLY 1 PERSON MAY GO WITH YOU TO SHORT STAY TO GET  READY MORNING OF [place].  Do not eat food or drink liquids :After Midnight.     Take these medicines the morning of surgery with A SIP OF WATER: none DO NOT TAKE ANY DIABETIC MEDICATIONS DAY OF YOUR SURGERY                               You may not have any metal on your body including hair pins and              piercings  Do not wear jewelry, make-up, lotions, powders or perfumes, deodorant             Do not wear nail polish.  Do not shave  48 hours prior to surgery.              Men may shave face and neck.   Do not bring valuables to the hospital. [place].  Contacts, dentures or bridgework may not be worn into surgery.  Leave suitcase in the car. After surgery it may be brought to your room.     Patients discharged the day of surgery will not be allowed to drive home.  Name and phone number of your driver:  Special Instructions: N/A              Please read over the following fact sheets you were given: _____________________________________________________________________             [facility] - Preparing for Surgery Before surgery, you can play an important role.  Because skin is not sterile, your skin needs to be as free of germs as possible.  You can reduce the number of germs on your skin by washing with CHG (chlorahexidine gluconate) soap before surgery.  CHG is an antiseptic cleaner which kills germs and bonds with the skin to continue killing germs even after washing. Please DO NOT use if you have an allergy to CHG or antibacterial soaps.  If your skin becomes reddened/irritated  stop using the CHG and inform your nurse when you arrive at Short Stay. Do not shave (including legs and underarms) for at least 48 hours prior to the first CHG shower.  You may shave your face/neck. Please follow these instructions carefully:  1.  Shower with CHG Soap the night before surgery and the  morning of Surgery.  2.  If you choose to wash your hair, wash your hair first as usual with your  normal  shampoo.  3.  After you shampoo, rinse your hair and body thoroughly to remove the  shampoo.                           4.  Use CHG as you would any other liquid soap.  You can apply chg directly  to the skin and wash  Gently with a scrungie or clean washcloth.  5.  Apply the CHG Soap to your body ONLY FROM THE NECK DOWN.   Do not use on face/ open                           Wound or open sores. Avoid contact with eyes, ears mouth and genitals (private parts).                       Wash face,  Genitals (private parts) with your normal soap.             6.  Wash thoroughly, paying special attention to the area where your surgery  will be performed.  7.  Thoroughly rinse your body with warm water from the neck down.  8.  DO NOT shower/wash with your normal soap after using and rinsing off  the CHG Soap.                9.  Pat yourself dry with a clean towel.            10.  Wear clean pajamas.            11.  Place clean sheets on your bed the night of your first shower and do not  sleep with pets. Day of Surgery : Do not apply any lotions/deodorants the morning of surgery.  Please wear clean clothes to the hospital/surgery center.  FAILURE TO FOLLOW THESE INSTRUCTIONS MAY RESULT IN THE CANCELLATION OF YOUR SURGERY PATIENT SIGNATURE_________________________________  NURSE SIGNATURE__________________________________  ________________________________________________________________________   Adam Phenix  An incentive spirometer is a tool that can help keep your  lungs clear and active. This tool measures how well you are filling your lungs with each breath. Taking [place] deep breaths may help reverse or decrease the chance of developing breathing (pulmonary) problems (especially infection) following:  A [place] period of time when you are unable to move or be active. BEFORE THE PROCEDURE   If the spirometer includes an indicator to show your best effort, your nurse or respiratory therapist will set it to a desired goal.  If possible, sit up straight or lean slightly forward. Try not to slouch.  Hold the incentive spirometer in an upright position. INSTRUCTIONS FOR USE   Sit on the edge of your bed if possible, or sit up as far as you can in bed or on a chair.  Hold the incentive spirometer in an upright position.  Breathe out normally.  Place the mouthpiece in your mouth and seal your lips tightly around it.  Breathe in slowly and as deeply as possible, raising the piston or the ball toward the top of the column.  Hold your breath for 3-5 seconds or for as Ines as possible. Allow the piston or ball to fall to the bottom of the column.  Remove the mouthpiece from your mouth and breathe out normally.  Rest for a few seconds and repeat Steps 1 through 7 at least 10 times every 1-2 hours when you are awake. Take your time and take a few normal breaths between deep breaths.  The spirometer may include an indicator to show your best effort. Use the indicator as a goal to work toward during each repetition.  After each set of 10 deep breaths, practice coughing to be sure your lungs are clear. If you have an incision (the cut made at the time of surgery),  support your incision when coughing by placing a pillow or rolled up towels firmly against it. Once you are able to get out of bed, walk around indoors and cough well. You may stop using the incentive spirometer when instructed by your caregiver.  RISKS AND COMPLICATIONS  Take your time so you do not  get dizzy or light-headed.  If you are in pain, you may need to take or ask for pain medication before doing incentive spirometry. It is harder to take a deep breath if you are having pain. AFTER USE  Rest and breathe slowly and easily.  It can be helpful to keep track of a log of your progress. Your caregiver can provide you with a simple table to help with this. If you are using the spirometer at home, follow these instructions: [place] IF:   You are having difficultly using the spirometer.  You have trouble using the spirometer as often as instructed.  Your pain medication is not giving enough relief while using the spirometer.  You develop fever of 100.5 F (38.1 C) or higher. SEEK IMMEDIATE MEDICAL CARE IF:   You cough up bloody sputum that had not been present before.  You develop fever of 102 F (38.9 C) or greater.  You develop worsening pain at or near the incision site. MAKE SURE YOU:   Understand these instructions.  Will watch your condition.  Will get help right away if you are not doing well or get worse. Document Released: 01/29/2007 Document Revised: 12/11/2011 Document Reviewed: 04/01/2007 Exit[employer] Patient Information 2014 [employer], [place].   ________________________________________________________________________  WHAT IS A BLOOD TRANSFUSION? Blood Transfusion Information  A transfusion is the replacement of blood or some of its parts. Blood is made up of multiple cells which provide different functions.  Red blood cells carry oxygen and are used for blood loss replacement.  White blood cells fight against infection.  Platelets control bleeding.  Plasma helps clot blood.  Other blood products are available for specialized needs, such as hemophilia or other clotting disorders. BEFORE THE TRANSFUSION  Who gives blood for transfusions?   Healthy volunteers who are fully evaluated to make sure their blood is safe. This is blood bank  blood. Transfusion therapy is the safest it has ever been in the practice of medicine. Before blood is taken from a donor, a complete history is taken to make sure that person has no history of diseases nor engages in risky social behavior (examples are intravenous drug use or sexual activity with multiple partners). The donor's travel history is screened to minimize risk of transmitting infections, such as malaria. The donated blood is tested for signs of infectious diseases, such as HIV and hepatitis. The blood is then tested to be sure it is compatible with you in order to minimize the chance of a transfusion reaction. If you or a relative donates blood, this is often done in anticipation of surgery and is not appropriate for emergency situations. It takes many days to process the donated blood. RISKS AND COMPLICATIONS Although transfusion therapy is very safe and saves many lives, the main dangers of transfusion include:   Getting an infectious disease.  Developing a transfusion reaction. This is an allergic reaction to something in the blood you were given. Every precaution is taken to prevent this. The decision to have a blood transfusion has been considered carefully by your caregiver before blood is given. Blood is not given unless the benefits outweigh the risks. AFTER THE TRANSFUSION  Right after receiving a blood transfusion, you will usually feel much better and more energetic. This is especially true if your red blood cells have gotten low (anemic). The transfusion raises the level of the red blood cells which carry oxygen, and this usually causes an energy increase.  The nurse administering the transfusion will monitor you carefully for complications. HOME CARE INSTRUCTIONS  No special instructions are needed after a transfusion. You may find your energy is better. Speak with your caregiver about any limitations on activity for underlying diseases you may have. SEEK MEDICAL CARE IF:    Your condition is not improving after your transfusion.  You develop redness or irritation at the intravenous (IV) site. SEEK IMMEDIATE MEDICAL CARE IF:  Any of the following symptoms occur over the next 12 hours:  Shaking chills.  You have a temperature by mouth above 102 F (38.9 C), not controlled by medicine.  Chest, back, or muscle pain.  People around you feel you are not acting correctly or are confused.  Shortness of breath or difficulty breathing.  Dizziness and fainting.  You get a rash or develop hives.  You have a decrease in urine output.  Your urine turns a dark color or changes to pink, red, or brown. Any of the following symptoms occur over the next 10 days:  You have a temperature by mouth above 102 F (38.9 C), not controlled by medicine.  Shortness of breath.  Weakness after normal activity.  The white part of the eye turns yellow (jaundice).  You have a decrease in the amount of urine or are urinating less often.  Your urine turns a dark color or changes to pink, red, or brown. Document Released: 09/15/2000 Document Revised: 12/11/2011 Document Reviewed: 05/04/2008 [facility] Patient Information 2014 [place], [place].  _______________________________________________________________________

## 2015-07-22 NOTE — Progress Notes (Signed)
Medical clearance note noelle redmon pa on chart for 08-02-15 surgery ekg 05-13-15 [facility] chart

## 2015-07-26 ENCOUNTER — Encounter (HOSPITAL_COMMUNITY): Payer: Self-pay

## 2015-07-26 ENCOUNTER — Encounter (HOSPITAL_COMMUNITY)
Admission: RE | Admit: 2015-07-26 | Discharge: 2015-07-26 | Disposition: A | Discharge status: Home / Self Care | Payer: BLUE CROSS/BLUE SHIELD | Source: Ambulatory Visit | Attending: Orthopedic Surgery | Admitting: Orthopedic Surgery

## 2015-07-26 DIAGNOSIS — M179 Osteoarthritis of knee, unspecified: Secondary | ICD-10-CM | POA: Insufficient documentation

## 2015-07-26 DIAGNOSIS — Z01818 Encounter for other preprocedural examination: Secondary | ICD-10-CM | POA: Diagnosis present

## 2015-07-26 HISTORY — DX: Other intervertebral disc displacement, lumbar region: M51.26

## 2015-07-26 LAB — COMPREHENSIVE METABOLIC PANEL
ALT: 18 U/L (ref 14–54)
AST: 19 U/L (ref 15–41)
Albumin: 4 g/dL (ref 3.5–5.0)
Alkaline Phosphatase: 52 U/L (ref 38–126)
Anion gap: 7 (ref 5–15)
BUN: 14 mg/dL (ref 6–20)
CO2: 27 mmol/L (ref 22–32)
Calcium: 8.9 mg/dL (ref 8.9–10.3)
Chloride: 107 mmol/L (ref 101–111)
Creatinine, Ser: 0.74 mg/dL (ref 0.44–1.00)
GFR calc Af Amer: >60 mL/min (ref >60)
GFR calc non Af Amer: >60 mL/min (ref >60)
Glucose, Bld: 93 mg/dL (ref 65–99)
Potassium: 4.2 mmol/L (ref 3.5–5.1)
Sodium: 141 mmol/L (ref 135–145)
Total Bilirubin: 0.4 mg/dL (ref 0.3–1.2)
Total Protein: 7 g/dL (ref 6.5–8.1)

## 2015-07-26 LAB — APTT: APTT: 27 s (ref 24–37)

## 2015-07-26 LAB — PROTIME-INR
INR: 0.89 (ref 0.00–1.49)
Prothrombin Time: 12.3 seconds (ref 11.6–15.2)

## 2015-07-26 LAB — CBC
HEMATOCRIT: 41.7 % (ref 36.0–46.0)
HEMOGLOBIN: 13.7 g/dL (ref 12.0–15.0)
MCH: 31 pg (ref 26.0–34.0)
MCHC: 32.9 g/dL (ref 30.0–36.0)
MCV: 94.3 fL (ref 78.0–100.0)
Platelets: 239 10*3/uL (ref 150–400)
RBC: 4.42 MIL/uL (ref 3.87–5.11)
RDW: 14.4 % (ref 11.5–15.5)
WBC: 7.4 10*3/uL (ref 4.0–10.5)

## 2015-07-26 LAB — SURGICAL PCR SCREEN
MRSA, PCR: NEGATIVE
Staphylococcus aureus: NEGATIVE

## 2015-07-26 LAB — URINALYSIS, ROUTINE W REFLEX MICROSCOPIC
BILIRUBIN URINE: NEGATIVE
Glucose, UA: NEGATIVE mg/dL
HGB URINE DIPSTICK: NEGATIVE
Ketones, ur: NEGATIVE mg/dL
Leukocytes, UA: NEGATIVE
NITRITE: NEGATIVE
PROTEIN: NEGATIVE mg/dL
SPECIFIC GRAVITY, URINE: 1.01 (ref 1.005–1.030)
UROBILINOGEN UA: 0.2 mg/dL (ref 0.0–1.0)
pH: 6.5 (ref 5.0–8.0)

## 2015-08-01 ENCOUNTER — Ambulatory Visit: Payer: Self-pay | Admitting: Orthopedic Surgery

## 2015-08-01 NOTE — H&P (Signed)
Stephanie Blake DOB: 09/14/1956 Married / Language: English / Race: White Female Date of Admission:  08/02/2015 CC:  Left Knee Pain History of Present Illness The patient is a 59 year old female who comes in for a preoperative History and Physical. The patient is scheduled for a left total knee arthroplasty to be performed by Dr. Dione Plover. Aluisio, MD at [facility] on 08-02-2015. The patient is a 59 year old female who presented for follow up of their knee. The patient is being followed for their left knee pain and osteoarthritis. They are now months out from left knee injection (and 1 yr. from right TKA). Symptoms reported include: pain (left), popping and grinding, while the patient does not report symptoms of: swelling. The patient feels that they are doing poorly with regards to the left and report their pain level to be mild to moderate and increased with the day. The following medication has been used for pain control: Aleve as needed. The patient has not gotten any relief of their symptoms with Cortisone injections. She states that the left knee is getting progressively worse at this time. Her right knee is doing great and she is really pleased with that. She did excellent with the replacement and her recovery. Left knee is now as bad as the right one was prior to her replacement. She is at a stage now where she wants to get the left knee replaced. They have been treated conservatively in the past for the above stated problem and despite conservative measures, they continue to have progressive pain and severe functional limitations and dysfunction. They have failed non-operative management including home exercise, medications, and injections. It is felt that they would benefit from undergoing total joint replacement. Risks and benefits of the procedure have been discussed with the patient and they elect to proceed with surgery. There are no active contraindications to surgery such as ongoing  infection or rapidly progressive neurological disease.  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 mother First Degree Relatives  Social History Alcohol use current drinker; drinks wine; less than 5 per week Current work status working part time Drug/Alcohol Rehab (Currently) no Children 2 Tobacco / smoke exposure no Living situation live with spouse Marital status married Tobacco use Former smoker. former smoker; smoke(d) 1/2 pack(s) per day Number of flights of stairs before winded 4-5 Pain Contract no Exercise Exercises rarely; does running / walking Illicit drug use no Drug/Alcohol Rehab (Previously) no  Medication History Aspirin (81MG  Tablet, Oral) Active. Aleve (Oral as needed) Specific dose unknown - Active. (prn) Glucosamine Complex (Oral) Specific dose unknown - Active. Lunesta (3MG  Tablet, Oral) Active.  Past Surgical History Cesarean Delivery 1 time Dilation and Curettage of Uterus   Review of Systems General Not Present- Chills, Fatigue, Fever, Memory Loss, Night Sweats, Weight Gain and Weight Loss. Skin Not Present- Eczema, Hives, Itching, Lesions and Rash. HEENT Not Present- Dentures, Double Vision, Headache, Hearing Loss, Tinnitus and Visual Loss. Respiratory Not Present- Allergies, Chronic Cough, Coughing up blood, Shortness of breath at rest and Shortness of breath with exertion. Cardiovascular Not Present- Chest Pain, Difficulty Breathing Lying Down, Murmur, Palpitations, Racing/skipping heartbeats and Swelling. Gastrointestinal Not Present- Abdominal Pain, Bloody Stool, Constipation, Diarrhea, Difficulty Swallowing, Heartburn, Jaundice, Loss of appetitie, Nausea and  Vomiting. Female Genitourinary Not Present-  Blood in Urine, Discharge, Flank Pain, Incontinence, Painful Urination, Urgency, Urinary frequency, Urinary Retention, Urinating at Night and Weak urinary stream. Musculoskeletal Present- Morning Stiffness. Not Present- Back Pain, Joint Pain, Joint Swelling, Muscle Pain, Muscle Weakness and Spasms. Neurological Not Present- Blackout spells, Difficulty with balance, Dizziness, Paralysis, Tremor and Weakness. Psychiatric Not Present- Insomnia.  Vitals Weight: 189 lb Height: 66in Weight was reported by patient. Height was reported by patient. Body Surface Area: 1.95 m Body Mass Index: 30.51 kg/m  BP: 124/72 (Sitting, Right Arm, Standard)  Physical Exam General Mental Status -Alert, cooperative and good historian. General Appearance-pleasant, Not in acute distress. Orientation-Oriented X3. Build & Nutrition-Well nourished and Well developed.  Head and Neck Head-normocephalic, atraumatic . Neck Global Assessment - supple, no bruit auscultated on the right, no bruit auscultated on the left.  Eye Vision-Wears corrective lenses. Pupil - Bilateral-Regular and Round. Motion - Bilateral-EOMI.  Chest and Lung Exam Auscultation Breath sounds - clear at anterior chest wall and clear at posterior chest wall. Adventitious sounds - No Adventitious sounds.  Cardiovascular Auscultation Rhythm - Regular rate and rhythm. Heart Sounds - S1 WNL and S2 WNL. Murmurs & Other Heart Sounds - Auscultation of the heart reveals - No Murmurs.  Abdomen Palpation/Percussion Tenderness - Abdomen is non-tender to palpation. Rigidity (guarding) - Abdomen is soft. Auscultation Auscultation of the abdomen reveals - Bowel sounds normal.  Female Genitourinary Note: Not done, not pertinent to present illness   Musculoskeletal Note: On exam, she is alert and oriented, in no apparent distress. Right knee looks great. Range is 0 to 130 with  no tenderness or instability. Left knee, no effusion, range 5 to 125, marked crepitus on range of motion and tenderness medial greater than lateral with no instability.  RADIOGRAPHS AP and lateral both knees, prosthesis on the right is in excellent position, no abnormalities. On the left, she has bone on bone medial and patellofemoral varus deformity.  Assessment & Plan Primary osteoarthritis of left knee (M17.12) Note:Surgical Plans: Left Total Knee Replacement  Disposition: Home  PCP: Nonah Mattes, [facility] - Patient has been seen preoperatively and felt to be stable for surgery.  IV TXA  Anesthesia Issues: None  Signed electronically by Joelene Millin, III PA-C

## 2015-08-02 ENCOUNTER — Inpatient Hospital Stay (HOSPITAL_COMMUNITY): Payer: BLUE CROSS/BLUE SHIELD | Admitting: Anesthesiology

## 2015-08-02 ENCOUNTER — Inpatient Hospital Stay (HOSPITAL_COMMUNITY)
Admission: RE | Admit: 2015-08-02 | Discharge: 2015-08-03 | DRG: 470 | Disposition: A | Discharge status: Home Health | Payer: BLUE CROSS/BLUE SHIELD | Source: Ambulatory Visit | Attending: Orthopedic Surgery | Admitting: Orthopedic Surgery

## 2015-08-02 ENCOUNTER — Encounter (HOSPITAL_COMMUNITY): Payer: Self-pay | Admitting: *Deleted

## 2015-08-02 ENCOUNTER — Encounter (HOSPITAL_COMMUNITY)
Admission: RE | Disposition: A | Discharge status: Home Health | Payer: Self-pay | Source: Ambulatory Visit | Attending: Orthopedic Surgery

## 2015-08-02 DIAGNOSIS — M1710 Unilateral primary osteoarthritis, unspecified knee: Secondary | ICD-10-CM | POA: Diagnosis present

## 2015-08-02 DIAGNOSIS — E785 Hyperlipidemia, unspecified: Secondary | ICD-10-CM | POA: Diagnosis present

## 2015-08-02 DIAGNOSIS — Z96651 Presence of right artificial knee joint: Secondary | ICD-10-CM | POA: Diagnosis present

## 2015-08-02 DIAGNOSIS — M1712 Unilateral primary osteoarthritis, left knee: Secondary | ICD-10-CM | POA: Diagnosis present

## 2015-08-02 DIAGNOSIS — Z87891 Personal history of nicotine dependence: Secondary | ICD-10-CM | POA: Diagnosis not present

## 2015-08-02 DIAGNOSIS — Z01812 Encounter for preprocedural laboratory examination: Secondary | ICD-10-CM | POA: Diagnosis not present

## 2015-08-02 DIAGNOSIS — M179 Osteoarthritis of knee, unspecified: Secondary | ICD-10-CM | POA: Diagnosis present

## 2015-08-02 DIAGNOSIS — M25562 Pain in left knee: Secondary | ICD-10-CM | POA: Diagnosis present

## 2015-08-02 HISTORY — PX: TOTAL KNEE ARTHROPLASTY: SHX125

## 2015-08-02 LAB — TYPE AND SCREEN
ABO/RH(D): A POS
ANTIBODY SCREEN: NEGATIVE

## 2015-08-02 SURGERY — ARTHROPLASTY, KNEE, TOTAL
Anesthesia: Spinal | Site: Knee | Laterality: Left

## 2015-08-02 MED ORDER — METOCLOPRAMIDE HCL 5 MG/ML IJ SOLN: 5.0000 mg | Freq: Three times a day (TID) | INTRAMUSCULAR | Status: DC | PRN

## 2015-08-02 MED ORDER — DEXAMETHASONE SODIUM PHOSPHATE 10 MG/ML IJ SOLN
10.0000 mg | Freq: Once | INTRAMUSCULAR | Status: AC
  Administered 2015-08-02: 10 mg via INTRAVENOUS

## 2015-08-02 MED ORDER — CEFAZOLIN SODIUM-DEXTROSE 2-3 GM-% IV SOLR
2.0000 g | Freq: Four times a day (QID) | INTRAVENOUS | Status: AC
  Administered 2015-08-02 (×2): 2 g via INTRAVENOUS
  Filled 2015-08-02 (×2): qty 50

## 2015-08-02 MED ORDER — HYDROMORPHONE HCL 1 MG/ML IJ SOLN: 0.2500 mg | INTRAMUSCULAR | Status: DC | PRN

## 2015-08-02 MED ORDER — BUPIVACAINE LIPOSOME 1.3 % IJ SUSP
20.0000 mL | Freq: Once | INTRAMUSCULAR | Status: DC
  Filled 2015-08-02: qty 20

## 2015-08-02 MED ORDER — FENTANYL CITRATE (PF) 100 MCG/2ML IJ SOLN
INTRAMUSCULAR | Status: DC | PRN
  Administered 2015-08-02 (×2): 50 ug via INTRAVENOUS

## 2015-08-02 MED ORDER — DEXAMETHASONE SODIUM PHOSPHATE 10 MG/ML IJ SOLN
INTRAMUSCULAR | Status: AC
  Filled 2015-08-02: qty 1

## 2015-08-02 MED ORDER — SODIUM CHLORIDE 0.9 % IJ SOLN
INTRAMUSCULAR | Status: AC
  Filled 2015-08-02: qty 50

## 2015-08-02 MED ORDER — DEXTROSE-NACL 5-0.9 % IV SOLN
INTRAVENOUS | Status: DC
  Administered 2015-08-02 – 2015-08-03 (×2): via INTRAVENOUS

## 2015-08-02 MED ORDER — BISACODYL 10 MG RE SUPP: 10.0000 mg | Freq: Every day | RECTAL | Status: DC | PRN

## 2015-08-02 MED ORDER — LACTATED RINGERS IV SOLN
INTRAVENOUS | Status: DC | PRN
  Administered 2015-08-02 (×2): via INTRAVENOUS

## 2015-08-02 MED ORDER — FENTANYL CITRATE (PF) 100 MCG/2ML IJ SOLN
INTRAMUSCULAR | Status: AC
  Filled 2015-08-02: qty 4

## 2015-08-02 MED ORDER — MIDAZOLAM HCL 2 MG/2ML IJ SOLN
INTRAMUSCULAR | Status: AC
  Filled 2015-08-02: qty 4

## 2015-08-02 MED ORDER — MORPHINE SULFATE (PF) 2 MG/ML IV SOLN
2.0000 mg | INTRAVENOUS | Status: DC | PRN
  Administered 2015-08-02: 2 mg via INTRAVENOUS
  Filled 2015-08-02: qty 1

## 2015-08-02 MED ORDER — SODIUM CHLORIDE 0.9 % IV SOLN: INTRAVENOUS | Status: DC

## 2015-08-02 MED ORDER — DOCUSATE SODIUM 100 MG PO CAPS
100.0000 mg | ORAL_CAPSULE | Freq: Two times a day (BID) | ORAL | Status: DC
  Administered 2015-08-02 – 2015-08-03 (×3): 100 mg via ORAL

## 2015-08-02 MED ORDER — POLYETHYLENE GLYCOL 3350 17 G PO PACK
17.0000 g | PACK | Freq: Every day | ORAL | Status: DC | PRN
Start: 2015-08-02 — End: 2015-08-03

## 2015-08-02 MED ORDER — METHOCARBAMOL 500 MG PO TABS
500.0000 mg | ORAL_TABLET | Freq: Four times a day (QID) | ORAL | Status: DC | PRN
Start: 2015-08-02 — End: 2015-08-03
  Administered 2015-08-02: 500 mg via ORAL
  Filled 2015-08-02: qty 1

## 2015-08-02 MED ORDER — MORPHINE SULFATE (PF) 2 MG/ML IV SOLN
1.0000 mg | INTRAVENOUS | Status: DC | PRN
  Administered 2015-08-02: 1 mg via INTRAVENOUS
  Filled 2015-08-02: qty 1

## 2015-08-02 MED ORDER — SODIUM CHLORIDE 0.9 % IJ SOLN
INTRAMUSCULAR | Status: DC | PRN
  Administered 2015-08-02: 30 mL

## 2015-08-02 MED ORDER — PROPOFOL 10 MG/ML IV BOLUS
INTRAVENOUS | Status: AC
  Filled 2015-08-02: qty 20

## 2015-08-02 MED ORDER — PHENYLEPHRINE HCL 10 MG/ML IJ SOLN
INTRAMUSCULAR | Status: DC | PRN
  Administered 2015-08-02: 40 ug via INTRAVENOUS
  Administered 2015-08-02: 80 ug via INTRAVENOUS

## 2015-08-02 MED ORDER — ACETAMINOPHEN 10 MG/ML IV SOLN
1000.0000 mg | Freq: Once | INTRAVENOUS | Status: AC
  Administered 2015-08-02: 1000 mg via INTRAVENOUS
  Filled 2015-08-02: qty 100

## 2015-08-02 MED ORDER — ONDANSETRON HCL 4 MG/2ML IJ SOLN
INTRAMUSCULAR | Status: AC
  Filled 2015-08-02: qty 2

## 2015-08-02 MED ORDER — TRANEXAMIC ACID 1000 MG/10ML IV SOLN
1000.0000 mg | INTRAVENOUS | Status: AC
  Administered 2015-08-02: 1000 mg via INTRAVENOUS
  Filled 2015-08-02: qty 10

## 2015-08-02 MED ORDER — ONDANSETRON HCL 4 MG/2ML IJ SOLN
INTRAMUSCULAR | Status: DC | PRN
  Administered 2015-08-02: 4 mg via INTRAVENOUS

## 2015-08-02 MED ORDER — METOCLOPRAMIDE HCL 10 MG PO TABS: 5.0000 mg | ORAL_TABLET | Freq: Three times a day (TID) | ORAL | Status: DC | PRN

## 2015-08-02 MED ORDER — RIVAROXABAN 10 MG PO TABS
10.0000 mg | ORAL_TABLET | Freq: Every day | ORAL | Status: DC
  Administered 2015-08-03: 10 mg via ORAL
  Filled 2015-08-02 (×2): qty 1

## 2015-08-02 MED ORDER — ACETAMINOPHEN 500 MG PO TABS
1000.0000 mg | ORAL_TABLET | Freq: Four times a day (QID) | ORAL | Status: AC
  Administered 2015-08-02 – 2015-08-03 (×4): 1000 mg via ORAL
  Filled 2015-08-02 (×4): qty 2

## 2015-08-02 MED ORDER — ATORVASTATIN CALCIUM 20 MG PO TABS
20.0000 mg | ORAL_TABLET | Freq: Every evening | ORAL | Status: DC
  Administered 2015-08-02: 20 mg via ORAL
  Filled 2015-08-02 (×2): qty 1

## 2015-08-02 MED ORDER — PROPOFOL 500 MG/50ML IV EMUL
INTRAVENOUS | Status: DC | PRN
  Administered 2015-08-02: 75 ug/kg/min via INTRAVENOUS

## 2015-08-02 MED ORDER — ZOLPIDEM TARTRATE 5 MG PO TABS
5.0000 mg | ORAL_TABLET | Freq: Every evening | ORAL | Status: DC | PRN
  Administered 2015-08-03: 5 mg via ORAL
  Filled 2015-08-02: qty 1

## 2015-08-02 MED ORDER — DIPHENHYDRAMINE HCL 12.5 MG/5ML PO ELIX
12.5000 mg | ORAL_SOLUTION | ORAL | Status: DC | PRN
Start: 2015-08-02 — End: 2015-08-03

## 2015-08-02 MED ORDER — LIDOCAINE HCL (CARDIAC) 20 MG/ML IV SOLN
INTRAVENOUS | Status: AC
  Filled 2015-08-02: qty 5

## 2015-08-02 MED ORDER — BUPIVACAINE HCL (PF) 0.25 % IJ SOLN
INTRAMUSCULAR | Status: AC
  Filled 2015-08-02: qty 30

## 2015-08-02 MED ORDER — CEFAZOLIN SODIUM-DEXTROSE 2-3 GM-% IV SOLR
INTRAVENOUS | Status: AC
  Filled 2015-08-02: qty 50

## 2015-08-02 MED ORDER — MENTHOL 3 MG MT LOZG: 1.0000 | LOZENGE | OROMUCOSAL | Status: DC | PRN

## 2015-08-02 MED ORDER — KETOROLAC TROMETHAMINE 15 MG/ML IJ SOLN
7.5000 mg | Freq: Four times a day (QID) | INTRAMUSCULAR | Status: AC | PRN
  Administered 2015-08-02 – 2015-08-03 (×2): 7.5 mg via INTRAVENOUS
  Filled 2015-08-02 (×2): qty 1

## 2015-08-02 MED ORDER — ACETAMINOPHEN 325 MG PO TABS: 650.0000 mg | ORAL_TABLET | Freq: Four times a day (QID) | ORAL | Status: DC | PRN

## 2015-08-02 MED ORDER — FLEET ENEMA 7-19 GM/118ML RE ENEM: 1.0000 | ENEMA | Freq: Once | RECTAL | Status: DC | PRN

## 2015-08-02 MED ORDER — ACETAMINOPHEN 650 MG RE SUPP: 650.0000 mg | Freq: Four times a day (QID) | RECTAL | Status: DC | PRN

## 2015-08-02 MED ORDER — TRAMADOL HCL 50 MG PO TABS: 50.0000 mg | ORAL_TABLET | Freq: Four times a day (QID) | ORAL | Status: DC | PRN

## 2015-08-02 MED ORDER — CEFAZOLIN SODIUM-DEXTROSE 2-3 GM-% IV SOLR
2.0000 g | INTRAVENOUS | Status: AC
  Administered 2015-08-02: 2 g via INTRAVENOUS

## 2015-08-02 MED ORDER — OXYCODONE HCL 5 MG PO TABS
5.0000 mg | ORAL_TABLET | ORAL | Status: DC | PRN
  Administered 2015-08-02: 5 mg via ORAL
  Administered 2015-08-02 – 2015-08-03 (×7): 10 mg via ORAL
  Filled 2015-08-02: qty 1
  Filled 2015-08-02 (×7): qty 2

## 2015-08-02 MED ORDER — ONDANSETRON HCL 4 MG PO TABS: 4.0000 mg | ORAL_TABLET | Freq: Four times a day (QID) | ORAL | Status: DC | PRN

## 2015-08-02 MED ORDER — ONDANSETRON HCL 4 MG/2ML IJ SOLN: 4.0000 mg | Freq: Four times a day (QID) | INTRAMUSCULAR | Status: DC | PRN

## 2015-08-02 MED ORDER — PHENOL 1.4 % MT LIQD: 1.0000 | OROMUCOSAL | Status: DC | PRN

## 2015-08-02 MED ORDER — PHENYLEPHRINE 40 MCG/ML (10ML) SYRINGE FOR IV PUSH (FOR BLOOD PRESSURE SUPPORT)
PREFILLED_SYRINGE | INTRAVENOUS | Status: AC
  Filled 2015-08-02: qty 10

## 2015-08-02 MED ORDER — BUPIVACAINE HCL 0.25 % IJ SOLN
INTRAMUSCULAR | Status: DC | PRN
  Administered 2015-08-02: 20 mL

## 2015-08-02 MED ORDER — ACETAMINOPHEN 10 MG/ML IV SOLN
INTRAVENOUS | Status: AC
  Filled 2015-08-02: qty 100

## 2015-08-02 MED ORDER — DEXAMETHASONE SODIUM PHOSPHATE 10 MG/ML IJ SOLN
10.0000 mg | Freq: Once | INTRAMUSCULAR | Status: AC
  Administered 2015-08-03: 10 mg via INTRAVENOUS
  Filled 2015-08-02: qty 1

## 2015-08-02 MED ORDER — BUPIVACAINE IN DEXTROSE 0.75-8.25 % IT SOLN
INTRATHECAL | Status: DC | PRN
  Administered 2015-08-02: 13.5 mg via INTRATHECAL

## 2015-08-02 MED ORDER — METHOCARBAMOL 1000 MG/10ML IJ SOLN
500.0000 mg | Freq: Four times a day (QID) | INTRAMUSCULAR | Status: DC | PRN
  Administered 2015-08-03: 500 mg via INTRAVENOUS
  Filled 2015-08-02 (×4): qty 5

## 2015-08-02 MED ORDER — BUPIVACAINE LIPOSOME 1.3 % IJ SUSP
INTRAMUSCULAR | Status: DC | PRN
  Administered 2015-08-02: 20 mL

## 2015-08-02 SURGICAL SUPPLY — 51 items
BAG DECANTER FOR FLEXI CONT (MISCELLANEOUS) IMPLANT
BAG SPEC THK2 15X12 ZIP CLS (MISCELLANEOUS) ×1
BAG ZIPLOCK 12X15 (MISCELLANEOUS) ×3 IMPLANT
BANDAGE ELASTIC 6 VELCRO ST LF (GAUZE/BANDAGES/DRESSINGS) ×3 IMPLANT
BLADE SAG 18X100X1.27 (BLADE) ×3 IMPLANT
BLADE SAW SGTL 11.0X1.19X90.0M (BLADE) ×3 IMPLANT
BOWL SMART MIX CTS (DISPOSABLE) ×3 IMPLANT
CAPT KNEE TOTAL 3 ATTUNE ×2 IMPLANT
CEMENT HV SMART SET (Cement) ×6 IMPLANT
CLOSURE WOUND 1/2 X4 (GAUZE/BANDAGES/DRESSINGS) ×1
CLOTH BEACON ORANGE TIMEOUT ST (SAFETY) ×3 IMPLANT
CUFF TOURN SGL QUICK 34 (TOURNIQUET CUFF) ×3
CUFF TRNQT CYL 34X4X40X1 (TOURNIQUET CUFF) ×1 IMPLANT
DECANTER SPIKE VIAL GLASS SM (MISCELLANEOUS) ×3 IMPLANT
DRAPE U-SHAPE 47X51 STRL (DRAPES) ×3 IMPLANT
DRSG ADAPTIC 3X8 NADH LF (GAUZE/BANDAGES/DRESSINGS) ×3 IMPLANT
DRSG PAD ABDOMINAL 8X10 ST (GAUZE/BANDAGES/DRESSINGS) ×3 IMPLANT
DURAPREP 26ML APPLICATOR (WOUND CARE) ×3 IMPLANT
ELECT REM PT RETURN 9FT ADLT (ELECTROSURGICAL) ×3
ELECTRODE REM PT RTRN 9FT ADLT (ELECTROSURGICAL) ×1 IMPLANT
EVACUATOR 1/8 PVC DRAIN (DRAIN) ×3 IMPLANT
GAUZE SPONGE 4X4 12PLY STRL (GAUZE/BANDAGES/DRESSINGS) ×3 IMPLANT
GLOVE BIO SURGEON STRL SZ7.5 (GLOVE) ×3 IMPLANT
GLOVE BIO SURGEON STRL SZ8 (GLOVE) ×5 IMPLANT
GLOVE BIOGEL PI IND STRL 6.5 (GLOVE) IMPLANT
GLOVE BIOGEL PI IND STRL 8 (GLOVE) ×1 IMPLANT
GLOVE BIOGEL PI INDICATOR 6.5 (GLOVE)
GLOVE BIOGEL PI INDICATOR 8 (GLOVE) ×2
GLOVE SURG SS PI 6.5 STRL IVOR (GLOVE) IMPLANT
GOWN STRL REUS W/TWL LRG LVL3 (GOWN DISPOSABLE) ×3 IMPLANT
GOWN STRL REUS W/TWL XL LVL3 (GOWN DISPOSABLE) ×2 IMPLANT
HANDPIECE INTERPULSE COAX TIP (DISPOSABLE) ×3
IMMOBILIZER KNEE 20 (SOFTGOODS) ×3
IMMOBILIZER KNEE 20 THIGH 36 (SOFTGOODS) ×1 IMPLANT
MANIFOLD NEPTUNE II (INSTRUMENTS) ×3 IMPLANT
NS IRRIG 1000ML POUR BTL (IV SOLUTION) ×3 IMPLANT
PACK TOTAL KNEE CUSTOM (KITS) ×3 IMPLANT
PADDING CAST COTTON 6X4 STRL (CAST SUPPLIES) ×5 IMPLANT
POSITIONER SURGICAL ARM (MISCELLANEOUS) ×3 IMPLANT
SET HNDPC FAN SPRY TIP SCT (DISPOSABLE) ×1 IMPLANT
STRIP CLOSURE SKIN 1/2X4 (GAUZE/BANDAGES/DRESSINGS) ×3 IMPLANT
SUT MNCRL AB 4-0 PS2 18 (SUTURE) ×3 IMPLANT
SUT VIC AB 2-0 CT1 27 (SUTURE) ×9
SUT VIC AB 2-0 CT1 TAPERPNT 27 (SUTURE) ×3 IMPLANT
SUT VLOC 180 0 24IN GS25 (SUTURE) ×3 IMPLANT
SYR 50ML LL SCALE MARK (SYRINGE) ×3 IMPLANT
TRAY FOLEY W/METER SILVER 14FR (SET/KITS/TRAYS/PACK) ×3 IMPLANT
TRAY FOLEY W/METER SILVER 16FR (SET/KITS/TRAYS/PACK) ×1 IMPLANT
WATER STERILE IRR 1500ML POUR (IV SOLUTION) ×3 IMPLANT
WRAP KNEE MAXI GEL POST OP (GAUZE/BANDAGES/DRESSINGS) ×3 IMPLANT
YANKAUER SUCT BULB TIP 10FT TU (MISCELLANEOUS) IMPLANT

## 2015-08-02 NOTE — Discharge Instructions (Addendum)
° °Dr. Frank Aluisio °Total Joint Specialist °[facility] °[address] °[place], [place] [address] °[phone] ° °TOTAL KNEE REPLACEMENT POSTOPERATIVE DIRECTIONS ° °Knee Rehabilitation, Guidelines Following Surgery  °Results after knee surgery are often greatly improved when you follow the exercise, range of motion and muscle strengthening exercises prescribed by your doctor. Safety measures are also important to protect the knee from further injury. Any time any of these exercises cause you to have increased pain or swelling in your knee joint, decrease the amount until you are comfortable again and slowly increase them. If you have problems or questions, call your caregiver or physical therapist for advice.  ° °HOME CARE INSTRUCTIONS  °Remove items at home which could result in a fall. This includes throw rugs or furniture in walking pathways.  °· ICE to the affected knee every three hours for 30 minutes at a time and then as needed for pain and swelling.  Continue to use ice on the knee for pain and swelling from surgery. You may notice swelling that will progress down to the foot and ankle.  This is normal after surgery.  Elevate the leg when you are not up walking on it.   °· Continue to use the breathing machine which will help keep your temperature down.  It is common for your temperature to cycle up and down following surgery, especially at night when you are not up moving around and exerting yourself.  The breathing machine keeps your lungs expanded and your temperature down. °· Do not place pillow under knee, focus on keeping the knee straight while resting ° °DIET °You may resume your previous home diet once your are discharged from the hospital. ° °DRESSING / WOUND CARE / SHOWERING °You may shower 3 days after surgery, but keep the wounds dry during showering.  You may use an occlusive plastic wrap (Press'n Seal for example), NO SOAKING/SUBMERGING IN THE BATHTUB.  If the  bandage gets wet, change with a clean dry gauze.  If the incision gets wet, pat the wound dry with a clean towel. °You may start showering once you are discharged home but do not submerge the incision under water. Just pat the incision dry and apply a dry gauze dressing on daily. °Change the surgical dressing daily and reapply a dry dressing each time. ° °ACTIVITY °Walk with your walker as instructed. °Use walker as [place] as suggested by your caregivers. °Avoid periods of inactivity such as sitting longer than an hour when not asleep. This helps prevent blood clots.  °You may resume a sexual relationship in one month or when given the OK by your doctor.  °You may return to work once you are cleared by your doctor.  °Do not drive a car for 6 weeks or until released by you surgeon.  °Do not drive while taking narcotics. ° °WEIGHT BEARING °Weight bearing as tolerated with assist device (walker, cane, etc) as directed, use it as Antwi as suggested by your surgeon or therapist, typically at least 4-6 weeks. ° °POSTOPERATIVE CONSTIPATION PROTOCOL °Constipation - defined medically as fewer than three stools per week and severe constipation as less than one stool per week. ° °One of the most common issues patients have following surgery is constipation.  Even if you have a regular bowel pattern at home, your normal regimen is likely to be disrupted due to multiple reasons following surgery.  Combination of anesthesia, postoperative narcotics, change in appetite and fluid intake all can affect your bowels.    In order to avoid complications following surgery, here are some recommendations in order to help you during your recovery period. ° °Colace (docusate) - Pick up an over-the-counter form of Colace or another stool softener and take twice a day as [place] as you are requiring postoperative pain medications.  Take with a full glass of water daily.  If you experience loose stools or diarrhea, hold the colace until you stool forms  back up.  If your symptoms do not get better within 1 week or if they get worse, check with your doctor. ° °Dulcolax (bisacodyl) - Pick up over-the-counter and take as directed by the product packaging as needed to assist with the movement of your bowels.  Take with a full glass of water.  Use this product as needed if not relieved by Colace only.  ° °MiraLax (polyethylene glycol) - Pick up over-the-counter to have on hand.  MiraLax is a solution that will increase the amount of water in your bowels to assist with bowel movements.  Take as directed and can mix with a glass of water, juice, soda, coffee, or tea.  Take if you go more than two days without a movement. °Do not use MiraLax more than once per day. Call your doctor if you are still constipated or irregular after using this medication for 7 days in a row. ° °If you continue to have problems with postoperative constipation, please contact the office for further assistance and recommendations.  If you experience "the worst abdominal pain ever" or develop nausea or vomiting, please contact the office immediatly for further recommendations for treatment. ° °ITCHING ° If you experience itching with your medications, try taking only a single pain pill, or even half a pain pill at a time.  You can also use Benadryl over the counter for itching or also to help with sleep.  ° °TED HOSE STOCKINGS °Wear the elastic stockings on both legs for three weeks following surgery during the day but you may remove then at night for sleeping. ° °MEDICATIONS °See your medication summary on the “After Visit Summary” that the nursing staff will review with you prior to discharge.  You may have some home medications which will be placed on hold until you complete the course of blood thinner medication.  It is important for you to complete the blood thinner medication as prescribed by your surgeon.  Continue your approved medications as instructed at time of  discharge. ° °PRECAUTIONS °If you experience chest pain or shortness of breath - call 911 immediately for transfer to the hospital emergency department.  °If you develop a fever greater that 101 F, purulent drainage from wound, increased redness or drainage from wound, foul odor from the wound/dressing, or calf pain - CONTACT YOUR SURGEON.   °                                                °FOLLOW-UP APPOINTMENTS °Make sure you keep all of your appointments after your operation with your surgeon and caregivers. You should call the office at the above phone number and make an appointment for approximately two weeks after the date of your surgery or on the date instructed by your surgeon outlined in the "After Visit Summary". ° ° °RANGE OF MOTION AND STRENGTHENING EXERCISES  °Rehabilitation of the knee is important following a knee injury or   an operation. After just a few days of immobilization, the muscles of the thigh which control the knee become weakened and shrink (atrophy). Knee exercises are designed to build up the tone and strength of the thigh muscles and to improve knee motion. Often times heat used for twenty to thirty minutes before working out will loosen up your tissues and help with improving the range of motion but do not use heat for the first two weeks following surgery. These exercises can be done on a training (exercise) mat, on the floor, on a table or on a bed. Use what ever works the best and is most comfortable for you Knee exercises include:  °Leg Lifts - While your knee is still immobilized in a splint or cast, you can do straight leg raises. Lift the leg to 60 degrees, hold for 3 sec, and slowly lower the leg. Repeat 10-20 times 2-3 times daily. Perform this exercise against resistance later as your knee gets better.  °Quad and Hamstring Sets - Tighten up the muscle on the front of the thigh (Quad) and hold for 5-10 sec. Repeat this 10-20 times hourly. Hamstring sets are done by pushing the  foot backward against an object and holding for 5-10 sec. Repeat as with quad sets.  °· Leg Slides: Lying on your back, slowly slide your foot toward your buttocks, bending your knee up off the floor (only go as far as is comfortable). Then slowly slide your foot back down until your leg is flat on the floor again. °· Angel Wings: Lying on your back spread your legs to the side as far apart as you can without causing discomfort.  °A rehabilitation program following serious knee injuries can speed recovery and prevent re-injury in the future due to weakened muscles. Contact your doctor or a physical therapist for more information on knee rehabilitation.  ° °IF YOU ARE TRANSFERRED TO A SKILLED REHAB FACILITY °If the patient is transferred to a skilled rehab facility following release from the hospital, a list of the current medications will be sent to the facility for the patient to continue.  When discharged from the skilled rehab facility, please have the facility set up the patient's Home Health Physical Therapy prior to being released. Also, the skilled facility will be responsible for providing the patient with their medications at time of release from the facility to include their pain medication, the muscle relaxants, and their blood thinner medication. If the patient is still at the rehab facility at time of the two week follow up appointment, the skilled rehab facility will also need to assist the patient in arranging follow up appointment in our office and any transportation needs. ° °MAKE SURE YOU:  °Understand these instructions.  °Get help right away if you are not doing well or get worse.  ° ° °Pick up stool softner and laxative for home use following surgery while on pain medications. °Do not submerge incision under water. °Please use good hand washing techniques while changing dressing each day. °May shower starting three days after surgery. °Please use a clean towel to pat the incision dry following  showers. °Continue to use ice for pain and swelling after surgery. °Do not use any lotions or creams on the incision until instructed by your surgeon. ° °Take Xarelto for two and a half more weeks, then discontinue Xarelto. °Once the patient has completed the Xarelto, they may resume the 81 mg Aspirin. ° ° °Information on my medicine - XARELTO® (Rivaroxaban) ° °  This medication education was reviewed with me or my healthcare representative as part of my discharge preparation.  The pharmacist that spoke with me during my hospital stay was:  Summe, Colleen E, RPH ° °Why was Xarelto® prescribed for you? °Xarelto® was prescribed for you to reduce the risk of blood clots forming after orthopedic surgery. The medical term for these abnormal blood clots is venous thromboembolism (VTE). ° °What do you need to know about xarelto® ? °Take your Xarelto® ONCE DAILY at the same time every day. °You may take it either with or without food. ° °If you have difficulty swallowing the tablet whole, you may crush it and mix in applesauce just prior to taking your dose. ° °Take Xarelto® exactly as prescribed by your doctor and DO NOT stop taking Xarelto® without talking to the doctor who prescribed the medication.  Stopping without other VTE prevention medication to take the place of Xarelto® may increase your risk of developing a clot. ° °After discharge, you should have regular check-up appointments with your healthcare provider that is prescribing your Xarelto®.   ° °What do you do if you miss a dose? °If you miss a dose, take it as soon as you remember on the same day then continue your regularly scheduled once daily regimen the next day. Do not take two doses of Xarelto® on the same day.  ° °Important Safety Information °A possible side effect of Xarelto® is bleeding. You should call your healthcare provider right away if you experience any of the following: °? Bleeding from an injury or your nose that does not stop. °? Unusual  colored urine (red or dark brown) or unusual colored stools (red or black). °? Unusual bruising for unknown reasons. °? A serious fall or if you hit your head (even if there is no bleeding). ° °Some medicines may interact with Xarelto® and might increase your risk of bleeding while on Xarelto®. To help avoid this, consult your healthcare provider or pharmacist prior to using any new prescription or non-prescription medications, including herbals, vitamins, non-steroidal anti-inflammatory drugs (NSAIDs) and supplements. ° °This website has more information on Xarelto®: www.[email or web address].com. ° ° ° °

## 2015-08-02 NOTE — H&P (View-Only) (Signed)
Stephanie Blake DOB: 17-May-1956 Married / Language: English / Race: White Female Date of Admission:  08/02/2015 CC:  Left Knee Pain History of Present Illness The patient is a 59 year old female who comes in for a preoperative History and Physical. The patient is scheduled for a left total knee arthroplasty to be performed by Dr. Dione Plover. Aluisio, MD at [facility] on 08-02-2015. The patient is a 59 year old female who presented for follow up of their knee. The patient is being followed for their left knee pain and osteoarthritis. They are now months out from left knee injection (and 1 yr. from right TKA). Symptoms reported include: pain (left), popping and grinding, while the patient does not report symptoms of: swelling. The patient feels that they are doing poorly with regards to the left and report their pain level to be mild to moderate and increased with the day. The following medication has been used for pain control: Aleve as needed. The patient has not gotten any relief of their symptoms with Cortisone injections. She states that the left knee is getting progressively worse at this time. Her right knee is doing great and she is really pleased with that. She did excellent with the replacement and her recovery. Left knee is now as bad as the right one was prior to her replacement. She is at a stage now where she wants to get the left knee replaced. They have been treated conservatively in the past for the above stated problem and despite conservative measures, they continue to have progressive pain and severe functional limitations and dysfunction. They have failed non-operative management including home exercise, medications, and injections. It is felt that they would benefit from undergoing total joint replacement. Risks and benefits of the procedure have been discussed with the patient and they elect to proceed with surgery. There are no active contraindications to surgery such as ongoing  infection or rapidly progressive neurological disease.  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 mother First Degree Relatives  Social History Alcohol use current drinker; drinks wine; less than 5 per week Current work status working part time Drug/Alcohol Rehab (Currently) no Children 2 Tobacco / smoke exposure no Living situation live with spouse Marital status married Tobacco use Former smoker. former smoker; smoke(d) 1/2 pack(s) per day Number of flights of stairs before winded 4-5 Pain Contract no Exercise Exercises rarely; does running / walking Illicit drug use no Drug/Alcohol Rehab (Previously) no  Medication History Aspirin (81MG  Tablet, Oral) Active. Aleve (Oral as needed) Specific dose unknown - Active. (prn) Glucosamine Complex (Oral) Specific dose unknown - Active. Lunesta (3MG  Tablet, Oral) Active.  Past Surgical History Cesarean Delivery 1 time Dilation and Curettage of Uterus   Review of Systems General Not Present- Chills, Fatigue, Fever, Memory Loss, Night Sweats, Weight Gain and Weight Loss. Skin Not Present- Eczema, Hives, Itching, Lesions and Rash. HEENT Not Present- Dentures, Double Vision, Headache, Hearing Loss, Tinnitus and Visual Loss. Respiratory Not Present- Allergies, Chronic Cough, Coughing up blood, Shortness of breath at rest and Shortness of breath with exertion. Cardiovascular Not Present- Chest Pain, Difficulty Breathing Lying Down, Murmur, Palpitations, Racing/skipping heartbeats and Swelling. Gastrointestinal Not Present- Abdominal Pain, Bloody Stool, Constipation, Diarrhea, Difficulty Swallowing, Heartburn, Jaundice, Loss of appetitie, Nausea and  Vomiting. Female Genitourinary Not Present-  Blood in Urine, Discharge, Flank Pain, Incontinence, Painful Urination, Urgency, Urinary frequency, Urinary Retention, Urinating at Night and Weak urinary stream. Musculoskeletal Present- Morning Stiffness. Not Present- Back Pain, Joint Pain, Joint Swelling, Muscle Pain, Muscle Weakness and Spasms. Neurological Not Present- Blackout spells, Difficulty with balance, Dizziness, Paralysis, Tremor and Weakness. Psychiatric Not Present- Insomnia.  Vitals Weight: 189 lb Height: 66in Weight was reported by patient. Height was reported by patient. Body Surface Area: 1.95 m Body Mass Index: 30.51 kg/m  BP: 124/72 (Sitting, Right Arm, Standard)  Physical Exam General Mental Status -Alert, cooperative and good historian. General Appearance-pleasant, Not in acute distress. Orientation-Oriented X3. Build & Nutrition-Well nourished and Well developed.  Head and Neck Head-normocephalic, atraumatic . Neck Global Assessment - supple, no bruit auscultated on the right, no bruit auscultated on the left.  Eye Vision-Wears corrective lenses. Pupil - Bilateral-Regular and Round. Motion - Bilateral-EOMI.  Chest and Lung Exam Auscultation Breath sounds - clear at anterior chest wall and clear at posterior chest wall. Adventitious sounds - No Adventitious sounds.  Cardiovascular Auscultation Rhythm - Regular rate and rhythm. Heart Sounds - S1 WNL and S2 WNL. Murmurs & Other Heart Sounds - Auscultation of the heart reveals - No Murmurs.  Abdomen Palpation/Percussion Tenderness - Abdomen is non-tender to palpation. Rigidity (guarding) - Abdomen is soft. Auscultation Auscultation of the abdomen reveals - Bowel sounds normal.  Female Genitourinary Note: Not done, not pertinent to present illness   Musculoskeletal Note: On exam, she is alert and oriented, in no apparent distress. Right knee looks great. Range is 0 to 130 with  no tenderness or instability. Left knee, no effusion, range 5 to 125, marked crepitus on range of motion and tenderness medial greater than lateral with no instability.  RADIOGRAPHS AP and lateral both knees, prosthesis on the right is in excellent position, no abnormalities. On the left, she has bone on bone medial and patellofemoral varus deformity.  Assessment & Plan Primary osteoarthritis of left knee (M17.12) Note:Surgical Plans: Left Total Knee Replacement  Disposition: Home  PCP: Nonah Mattes, [facility] - Patient has been seen preoperatively and felt to be stable for surgery.  IV TXA  Anesthesia Issues: None  Signed electronically by Joelene Millin, III PA-C

## 2015-08-02 NOTE — Op Note (Signed)
Pre-operative diagnosis- Osteoarthritis  Left knee(s)  Post-operative diagnosis- Osteoarthritis Left knee(s)  Procedure-  Left  Total Knee Arthroplasty  Surgeon- Dione Plover. Lilymae Swiech, MD  Assistant- Arlee Muslim, Pa-C   Anesthesia-  Spinal  EBL-* No blood loss amount entered *   Drains Hemovac  Tourniquet time-  Total Tourniquet Time Documented: Thigh (Left) - 34 minutes Total: Thigh (Left) - 34 minutes     Complications- None  Condition-PACU - hemodynamically stable.   Brief Clinical Note  Stephanie Blake is a 59 y.o. year old female with end stage OA of her left knee with progressively worsening pain and dysfunction. She has constant pain, with activity and at rest and significant functional deficits with difficulties even with ADLs. She has had extensive non-op management including analgesics, injections of cortisone and viscosupplements, and home exercise program, but remains in significant pain with significant dysfunction. Radiographs show bone on bone arthritis medial and patellofemoral. She presents now for left Total Knee Arthroplasty.    Procedure in detail---   The patient is brought into the operating room and positioned supine on the operating table. After successful administration of  Spinal,   a tourniquet is placed high on the  Left thigh(s) and the lower extremity is prepped and draped in the usual sterile fashion. Time out is performed by the operating team and then the  Left lower extremity is wrapped in Esmarch, knee flexed and the tourniquet inflated to 300 mmHg.       A midline incision is made with a ten blade through the subcutaneous tissue to the level of the extensor mechanism. A fresh blade is used to make a medial parapatellar arthrotomy. Soft tissue over the proximal medial tibia is subperiosteally elevated to the joint line with a knife and into the semimembranosus bursa with a Cobb elevator. Soft tissue over the proximal lateral tibia is elevated with attention  being paid to avoiding the patellar tendon on the tibial tubercle. The patella is everted, knee flexed 90 degrees and the ACL and PCL are removed. Findings are bone on bone medial and patellofemoral with large global osteophytes.        The drill is used to create a starting hole in the distal femur and the canal is thoroughly irrigated with sterile saline to remove the fatty contents. The 5 degree Left  valgus alignment guide is placed into the femoral canal and the distal femoral cutting block is pinned to remove 9 mm off the distal femur. Resection is made with an oscillating saw.      The tibia is subluxed forward and the menisci are removed. The extramedullary alignment guide is placed referencing proximally at the medial aspect of the tibial tubercle and distally along the second metatarsal axis and tibial crest. The block is pinned to remove 59mm off the more deficient medial  side. Resection is made with an oscillating saw. Size 5is the most appropriate size for the tibia and the proximal tibia is prepared with the modular drill and keel punch for that size.      The femoral sizing guide is placed and size 5 is most appropriate. Rotation is marked off the epicondylar axis and confirmed by creating a rectangular flexion gap at 90 degrees. The size 5 cutting block is pinned in this rotation and the anterior, posterior and chamfer cuts are made with the oscillating saw. The intercondylar block is then placed and that cut is made.      Trial size 5 tibial component,  trial size 5 posterior stabilized femur and a 8  mm posterior stabilized rotating platform insert trial is placed. Full extension is achieved with excellent varus/valgus and anterior/posterior balance throughout full range of motion. The patella is everted and thickness measured to be 22  mm. Free hand resection is taken to 12 mm, a 38 template is placed, lug holes are drilled, trial patella is placed, and it tracks normally. Osteophytes are  removed off the posterior femur with the trial in place. All trials are removed and the cut bone surfaces prepared with pulsatile lavage. Cement is mixed and once ready for implantation, the size 5 tibial implant, size  5 posterior stabilized femoral component, and the size 38 patella are cemented in place and the patella is held with the clamp. The trial insert is placed and the knee held in full extension. The Exparel (20 ml mixed with 30 ml saline) and .25% Bupivicaine, are injected into the extensor mechanism, posterior capsule, medial and lateral gutters and subcutaneous tissues.  All extruded cement is removed and once the cement is hard the permanent 8 mm posterior stabilized rotating platform insert is placed into the tibial tray.      The wound is copiously irrigated with saline solution and the extensor mechanism closed over a hemovac drain with #1 V-loc suture. The tourniquet is released for a total tourniquet time of 34  minutes. Flexion against gravity is 140 degrees and the patella tracks normally. Subcutaneous tissue is closed with 2.0 vicryl and subcuticular with running 4.0 Monocryl. The incision is cleaned and dried and steri-strips and a bulky sterile dressing are applied. The limb is placed into a knee immobilizer and the patient is awakened and transported to recovery in stable condition.      Please note that a surgical assistant was a medical necessity for this procedure in order to perform it in a safe and expeditious manner. Surgical assistant was necessary to retract the ligaments and vital neurovascular structures to prevent injury to them and also necessary for proper positioning of the limb to allow for anatomic placement of the prosthesis.   Dione Plover [name], MD    08/02/2015, 9:07 AM

## 2015-08-02 NOTE — Transfer of Care (Signed)
Immediate Anesthesia Transfer of Care Note  Patient: Stephanie Blake  Procedure(s) Performed: Procedure(s): LEFT TOTAL KNEE ARTHROPLASTY (Left)  Patient Location: PACU  Anesthesia Type:Spinal  Level of Consciousness: awake, alert  and oriented  Airway & Oxygen Therapy: Patient Spontanous Breathing and Patient connected to nasal cannula oxygen  Post-op Assessment: Report given to RN and Post -op Vital signs reviewed and stable  Post vital signs: Reviewed and stable  Last Vitals:  Filed Vitals:   08/02/15 0613  BP: 94/53  Pulse: 78  Temp: 36.7 C  Resp: 16    Complications: No apparent anesthesia complications

## 2015-08-02 NOTE — Interval H&P Note (Signed)
History and Physical Interval Note:  08/02/2015 7:02 AM  Stephanie Blake  has presented today for surgery, with the diagnosis of OA LEFT KNEE  The various methods of treatment have been discussed with the patient and family. After consideration of risks, benefits and other options for treatment, the patient has consented to  Procedure(s): LEFT TOTAL KNEE ARTHROPLASTY (Left) as a surgical intervention .  The patient's history has been reviewed, patient examined, no change in status, stable for surgery.  I have reviewed the patient's chart and labs.  Questions were answered to the patient's satisfaction.     Gearlean Alf

## 2015-08-02 NOTE — Anesthesia Preprocedure Evaluation (Addendum)
Anesthesia Evaluation  Patient identified by MRN, date of birth, ID band Patient awake    Reviewed: Allergy & Precautions, H&P , NPO status , Patient's Chart, lab work & pertinent test results  Airway Mallampati: III  TM Distance: >3 FB Neck ROM: Full    Dental no notable dental hx. (+) Teeth Intact, Dental Advisory Given   Pulmonary neg pulmonary ROS, former smoker,    Pulmonary exam normal breath sounds clear to auscultation       Cardiovascular negative cardio ROS   Rhythm:Regular Rate:Normal     Neuro/Psych negative neurological ROS  negative psych ROS   GI/Hepatic negative GI ROS, Neg liver ROS,   Endo/Other  negative endocrine ROS  Renal/GU negative Renal ROS  negative genitourinary   Musculoskeletal  (+) Arthritis , Osteoarthritis,    Abdominal   Peds  Hematology negative hematology ROS (+)   Anesthesia Other Findings   Reproductive/Obstetrics negative OB ROS                            Anesthesia Physical Anesthesia Plan  ASA: II  Anesthesia Plan: Spinal   Post-op Pain Management:    Induction: Intravenous  Airway Management Planned: Simple Face Mask  Additional Equipment:   Intra-op Plan:   Post-operative Plan:   Informed Consent: I have reviewed the patients History and Physical, chart, labs and discussed the procedure including the risks, benefits and alternatives for the proposed anesthesia with the patient or authorized representative who has indicated his/her understanding and acceptance.   Dental advisory given  Plan Discussed with: CRNA  Anesthesia Plan Comments:         Anesthesia Quick Evaluation

## 2015-08-02 NOTE — Progress Notes (Signed)
Utilization review completed.  

## 2015-08-02 NOTE — Progress Notes (Signed)
RN dangled patient this evening. Patient was restless and anxious to get out of the bed, so RN assisted patient to the chair and encouraged her to sit up for an hour or so. Patient was one assist, moved well, and walked approx 81f.

## 2015-08-02 NOTE — Anesthesia Postprocedure Evaluation (Signed)
  Anesthesia Post-op Note  Patient: Stephanie Blake  Procedure(s) Performed: Procedure(s): LEFT TOTAL KNEE ARTHROPLASTY (Left)  Patient Location: PACU  Anesthesia Type: Spinal/MAC  Level of Consciousness: awake and alert   Airway and Oxygen Therapy: Patient Spontanous Breathing  Post-op Pain: Controlled  Post-op Assessment: Post-op Vital signs reviewed, Patient's Cardiovascular Status Stable and Respiratory Function Stable. Block receeding.  Post-op Vital Signs: Reviewed  Filed Vitals:   08/02/15 1100  BP:   Pulse: 79  Temp: 36.3 C  Resp: 16    Complications: No apparent anesthesia complications

## 2015-08-02 NOTE — Anesthesia Procedure Notes (Signed)
Spinal Patient location during procedure: OR Start time: 08/02/2015 8:09 AM End time: 08/02/2015 8:11 AM Staffing Resident/CRNA: Chandra Batch A Preanesthetic Checklist Completed: patient identified, site marked, surgical consent, pre-op evaluation, timeout performed, IV checked, risks and benefits discussed and monitors and equipment checked Spinal Block Patient position: sitting Prep: Betadine Patient monitoring: heart rate, continuous pulse ox, blood pressure and cardiac monitor Approach: midline Location: L2-3 Injection technique: single-shot Needle Needle type: Sprotte  Needle gauge: 22 G Needle length: 9 cm Needle insertion depth: 6 cm Assessment Sensory level: T8

## 2015-08-03 LAB — BASIC METABOLIC PANEL
Anion gap: 6 (ref 5–15)
BUN: 10 mg/dL (ref 6–20)
CHLORIDE: 109 mmol/L (ref 101–111)
CO2: 26 mmol/L (ref 22–32)
Calcium: 8 mg/dL — ABNORMAL LOW (ref 8.9–10.3)
Creatinine, Ser: 0.68 mg/dL (ref 0.44–1.00)
GFR calc Af Amer: >60 mL/min (ref >60)
GLUCOSE: 278 mg/dL — AB (ref 65–99)
POTASSIUM: 3.8 mmol/L (ref 3.5–5.1)
Sodium: 141 mmol/L (ref 135–145)

## 2015-08-03 LAB — CBC
HCT: 31.8 % — ABNORMAL LOW (ref 36.0–46.0)
HEMOGLOBIN: 10.7 g/dL — AB (ref 12.0–15.0)
MCH: 32 pg (ref 26.0–34.0)
MCHC: 33.6 g/dL (ref 30.0–36.0)
MCV: 95.2 fL (ref 78.0–100.0)
PLATELETS: 223 10*3/uL (ref 150–400)
RBC: 3.34 MIL/uL — AB (ref 3.87–5.11)
RDW: 14.5 % (ref 11.5–15.5)
WBC: 15.6 10*3/uL — AB (ref 4.0–10.5)

## 2015-08-03 MED ORDER — TRAMADOL HCL 50 MG PO TABS: 50.0000 mg | ORAL_TABLET | Freq: Four times a day (QID) | ORAL | Status: AC | PRN

## 2015-08-03 MED ORDER — METHOCARBAMOL 500 MG PO TABS: 500.0000 mg | ORAL_TABLET | Freq: Four times a day (QID) | ORAL | Status: AC | PRN

## 2015-08-03 MED ORDER — OXYCODONE HCL 5 MG PO TABS: 5.0000 mg | ORAL_TABLET | ORAL | Status: AC | PRN

## 2015-08-03 MED ORDER — RIVAROXABAN 10 MG PO TABS: 10.0000 mg | ORAL_TABLET | Freq: Every day | ORAL | Status: AC

## 2015-08-03 MED ORDER — SODIUM CHLORIDE 0.9 % IV BOLUS (SEPSIS)
250.0000 mL | Freq: Once | INTRAVENOUS | Status: AC
  Administered 2015-08-03: 250 mL via INTRAVENOUS

## 2015-08-03 NOTE — Care Management Note (Signed)
Case Management Note  Patient Details  Name: ARLET MARTER MRN: [id number] Date of Birth: 01-23-1956  Subjective/Objective:                   LEFT TOTAL KNEE ARTHROPLASTY (Left) Action/Plan: Discharge planning  Expected Discharge Date:  08/03/15               Expected Discharge Plan:  [place]  In-House Referral:     Discharge planning Services  CM Consult  Post Acute Care Choice:  Home Health Choice offered to:  Patient  DME Arranged:  N/A DME Agency:  NA  HH Arranged:  PT HH Agency:  [place]  Status of Service:  Completed, signed off  Medicare Important Message Given:    Date Medicare IM Given:    Medicare IM give by:    Date Additional Medicare IM Given:    Additional Medicare Important Message give by:     If discussed at [place] of Stay Meetings, dates discussed:    Additional Comments: CM met with pt in room to offer choice of home health agency.  Pt chooses [facility] [place] to render HHPT.  Referral given to [facility] rep, Tim (on unit).  No DME is needed as pt has rolling walker and 3n1 at home.  No other CM needs were communicated. Dellie Catholic, RN 08/03/2015, 11:06 AM

## 2015-08-03 NOTE — Evaluation (Signed)
Physical Therapy Evaluation Patient Details Name: Stephanie Blake MRN: [id number] DOB: 06/22/1956 Today's Date: 08/03/2015   History of Present Illness  s/p L TKA; PMHx: R TKA  Clinical Impression  Pt admitted with above diagnosis. Pt currently with functional limitations due to the deficits listed below (see PT Problem List).  Pt will benefit from skilled PT to increase their independence and safety with mobility to allow discharge to the venue listed below.  Pt planning  For home with spouse, reported difficulty with stairs after last knee surgery--will practice in pm, pt for possible D/C today     Follow Up Recommendations Home health PT    Equipment Recommendations  None recommended by PT    Recommendations for Other Services       Precautions / Restrictions Precautions Precautions: Knee Restrictions Other Position/Activity Restrictions: WBAT      Mobility  Bed Mobility Overal bed mobility: Needs Assistance Bed Mobility: Supine to Sit     Supine to sit: Supervision     General bed mobility comments: for safety, incr time  Transfers Overall transfer level: Needs assistance Equipment used: Rolling walker (2 wheeled) Transfers: Sit to/from Stand Sit to Stand: Min guard         General transfer comment: verbal cues for hand placement and LLE position  Ambulation/Gait Ambulation/Gait assistance: Min guard Ambulation Distance (Feet): 150 Feet Assistive device: Rolling walker (2 wheeled) Gait Pattern/deviations: Step-to pattern;Antalgic     General Gait Details: cues for sequence (initially) and posture  Stairs            Wheelchair Mobility    Modified Rankin (Stroke Patients Only)       Balance                                             Pertinent Vitals/Pain Pain Assessment: 0-10 Pain Score: 4  Pain Location: L knee Pain Intervention(s): Limited activity within patient's tolerance;Monitored during session;Premedicated  before session;Repositioned;Ice applied    Home Living Family/patient expects to be discharged to:: Private residence Living Arrangements: Spouse/significant other Available Help at Discharge: Family;Available PRN/intermittently Type of Home: House Home Access: Stairs to enter Entrance Stairs-Rails: Right Entrance Stairs-Number of Steps: 3-4 Home Layout: Multi-level;Able to live on main level with bedroom/bathroom Home Equipment: Bedside commode;Cane - single point;Walker - 2 wheels      Prior Function Level of Independence: Independent               Hand Dominance        Extremity/Trunk Assessment   Upper Extremity Assessment: Defer to OT evaluation           Lower Extremity Assessment: LLE deficits/detail   LLE Deficits / Details: ankle WFL, knee extension and hip flexion 2 to 2+/5     Communication   Communication: No difficulties  Cognition Arousal/Alertness: Awake/alert Behavior During Therapy: WFL for tasks assessed/performed Overall Cognitive Status: Within Functional Limits for tasks assessed                      General Comments      Exercises Total Joint Exercises Ankle Circles/Pumps: AROM;Both;10 reps Quad Sets: Strengthening;Left;5 reps      Assessment/Plan    PT Assessment Patient needs continued PT services  PT Diagnosis Difficulty walking   PT Problem List Decreased strength;Decreased range of motion;Decreased mobility;Decreased knowledge of use of  DME;Pain  PT Treatment Interventions DME instruction;Gait training;Functional mobility training;Therapeutic activities;Therapeutic exercise;Patient/family education;Stair training   PT Goals (Current goals can be found in the Care Plan section) Acute Rehab PT Goals Patient Stated Goal: home soon, less pain with walking PT Goal Formulation: With patient Time For Goal Achievement: 08/06/15 Potential to Achieve Goals: Good    Frequency 7X/week   Barriers to discharge         Co-evaluation               End of Session Equipment Utilized During Treatment: Gait belt;Left knee immobilizer Activity Tolerance: Patient tolerated treatment well Patient left: in chair;with call bell/phone within reach;with chair alarm set           Time: 7225-7505 PT Time Calculation (min) (ACUTE ONLY): 24 min   Charges:   PT Evaluation $Initial PT Evaluation Tier I: 1 Procedure PT Treatments $Gait Training: 8-22 mins   PT G Codes:        Keiarah Orlowski 2015-08-28, 10:26 AM

## 2015-08-03 NOTE — Evaluation (Signed)
Occupational Therapy Evaluation Patient Details Name: Stephanie Blake MRN: [id number] DOB: August 29, 1956 Today's Date: 08/03/2015    History of Present Illness s/p L TKA; PMHx: R TKA   Clinical Impression   Pt is a 59 y/o female admitted as above. She presents with decreased ability to perform LB ADL's however her husband states that he is able to assist PRN. She was educated in shower transfers and [place] a/e PRN, she is currently Min guard LB ADL's and Min A shower transfer, supervision level toilet transfers and grooming standing at sink. They deny further acute OT needs at this time, will sign off.    Follow Up Recommendations  No OT follow up;Supervision - Intermittent    Equipment Recommendations  None recommended by OT;Other (comment) (Pt requested leg lifter, pt was educated on using RLE t& placing ankle under LLE - she was able to return demonstration after education)    Recommendations for Other Services       Precautions / Restrictions Precautions Precautions: Knee Restrictions Weight Bearing Restrictions: No Other Position/Activity Restrictions: WBAT      Mobility Bed Mobility Overal bed mobility: Needs Assistance Bed Mobility: Supine to Sit     Supine to sit: Supervision     General bed mobility comments: Pt received up in recliner  Transfers Overall transfer level: Needs assistance Equipment used: Rolling walker (2 wheeled) Transfers: Sit to/from [facility] Sit to Stand: Supervision Stand pivot transfers: Supervision       General transfer comment: Good technique during transfers; no hands on assist    Balance                                            ADL Overall ADL's : Needs assistance/impaired Eating/Feeding: Independent   Grooming: Wash/dry hands;Standing;Supervision/safety   Upper Body Bathing: Sitting;Modified independent   Lower Body Bathing: Min guard;Sit to/from stand   Upper Body Dressing :  Sitting;Modified independent   Lower Body Dressing: Min guard;Sit to/from stand   Toilet Transfer: Supervision/safety;RW;Ambulation (3:1 over toilet)   Toileting- Clothing Manipulation and Hygiene: Supervision/safety;Sit to/from stand   Tub/ Banker: Walk-in shower;Ambulation;Anterior/posterior;Rolling walker;Minimal assistance (Simulated transfer and handout issued and reviewed w/ pt and spouse)   Functional mobility during ADLs: Supervision/safety General ADL Comments: Pt is 59 y/o female s/p L TKA on 08/02/15, she and her husband were educated in Role of OT and she participated in breif ADL retraining session to incude simulated shower transfer, toileting using 3:1 over toilet (as pt states this is how she will "do it at home") for increased height. Pt demonstrated good technique using DME. A/E was reviewed with pt/spouse. Pt's husband stated that he would assist PRN w/ LB ADL's, handout issued for shower transfers after review and simulation. Pt reports no further acute OT needs at this time.     Vision  No change from baseline   Perception     Praxis      Pertinent Vitals/Pain Pain Assessment: 0-10 Pain Score: 5  Pain Location: L knee Pain Descriptors / Indicators: Sore;Aching Pain Intervention(s): Monitored during session;Premedicated before session     Hand Dominance Right   Extremity/Trunk Assessment Upper Extremity Assessment Upper Extremity Assessment: Overall WFL for tasks assessed   Lower Extremity Assessment Lower Extremity Assessment: Defer to PT evaluation LLE Deficits / Details: ankle WFL, knee extension and hip flexion 2 to 2+/5 LLE: Unable  to fully assess due to pain       Communication Communication Communication: No difficulties   Cognition Arousal/Alertness: Awake/alert Behavior During Therapy: WFL for tasks assessed/performed Overall Cognitive Status: Within Functional Limits for tasks assessed                     General  Comments       Exercises       Shoulder Instructions      Home Living Family/patient expects to be discharged to:: Private residence Living Arrangements: Spouse/significant other Available Help at Discharge: Family;Available PRN/intermittently Type of Home: House Home Access: Stairs to enter [employer] of Steps: 3-4 Entrance Stairs-Rails: Right Home Layout: Multi-level;Able to live on main level with bedroom/bathroom     Bathroom Shower/Tub: Occupational psychologist: Standard     Home Equipment: Bedside commode;Cane - single point;Walker - 2 wheels          Prior Functioning/Environment Level of Independence: Independent             OT Diagnosis: Acute pain   OT Problem List: Pain   OT Treatment/Interventions:      OT Goals(Current goals can be found in the care plan section) Acute Rehab OT Goals Patient Stated Goal: home soon, less pain with walking OT Goal Formulation: All assessment and education complete, DC therapy  OT Frequency:     Barriers to D/C:            Co-evaluation              End of Session Equipment Utilized During Treatment: Gait belt;Rolling walker;Left knee immobilizer  Activity Tolerance: Patient tolerated treatment well Patient left: in chair;with call bell/phone within reach;with chair alarm set;with family/visitor present   Time: 2119-4174 OT Time Calculation (min): 24 min Charges:  OT General Charges $OT Visit: 1 Procedure OT Evaluation $Initial OT Evaluation Tier I: 1 Procedure OT Treatments $Self Care/Home Management : 8-22 mins G-Codes:    Stephanie Blake, OTR/L 08/03/2015, 10:57 AM

## 2015-08-03 NOTE — Progress Notes (Signed)
   Subjective: 1 Day Post-Op Procedure(s) (LRB): LEFT TOTAL KNEE ARTHROPLASTY (Left) Patient reports pain as moderate.   Patient seen in rounds with Dr. Wynelle Link.  Doing better this morning following rough night. Patient is ready to get up with therapy and possibly go home later today if does well. Patient is ready to go home later today if does well.  Objective: Vital signs in last 24 hours: Temp:  [97.3 F (36.3 C)-98.3 F (36.8 C)] 97.9 F (36.6 C) (11/01 0515) Pulse Rate:  [63-80] 79 (11/01 0515) Resp:  [9-18] 16 (11/01 0515) BP: (88-112)/(42-69) 94/54 mmHg (11/01 0650) SpO2:  [96 %-100 %] 96 % (11/01 0515)  Intake/Output from previous day:  Intake/Output Summary (Last 24 hours) at 08/03/15 0849 Last data filed at 08/03/15 0600  Gross per 24 hour  Intake   4025 ml  Output   3745 ml  Net    280 ml    Intake/Output this shift: UOP 600 since around [place]  Labs:  Recent Labs  08/03/15 0440  HGB 10.7*    Recent Labs  08/03/15 0440  WBC 15.6*  RBC 3.34*  HCT 31.8*  PLT 223    Recent Labs  08/03/15 0440  NA 141  K 3.8  CL 109  CO2 26  BUN 10  CREATININE 0.68  GLUCOSE 278*  CALCIUM 8.0*   No results for input(s): LABPT, INR in the last 72 hours.  EXAM: General - Patient is Alert, Appropriate and Oriented Extremity - Neurovascular intact Sensation intact distally Dorsiflexion/Plantar flexion intact Incision - clean, dry, no drainage Motor Function - intact, moving foot and toes well on exam.   Assessment/Plan: 1 Day Post-Op Procedure(s) (LRB): LEFT TOTAL KNEE ARTHROPLASTY (Left) Procedure(s) (LRB): LEFT TOTAL KNEE ARTHROPLASTY (Left) Past Medical History  Diagnosis Date  . Hyperlipidemia   . Arthritis     osteoarthritis- knees  . Lumbar herniated disc     L 1 L 2 did injections last injections dec 2015   Principal Problem:   OA (osteoarthritis) of knee  Estimated body mass index is 32.13 kg/(m^2) as calculated from the following:   Height  as of this encounter: 5\' 6"  (1.676 m).   Weight as of this encounter: 90.266 kg (199 lb). Up with therapy Discharge home with home health Diet - Cardiac diet Follow up - in 2 weeks Activity - WBAT Disposition - Home Condition Upon Discharge - pending D/C Meds - See DC Summary DVT Prophylaxis - Xarelto  Arlee Muslim, PA-C Orthopaedic Surgery 08/03/2015, 8:49 AM

## 2015-08-03 NOTE — Progress Notes (Signed)
   08/03/15 1400  PT Visit Information  Last PT Received On 08/03/15  Assistance Needed +1  History of Present Illness s/p L TKA; PMHx: R TKA  PT Time Calculation  PT Start Time (ACUTE ONLY) 1325  PT Stop Time (ACUTE ONLY) 1357  PT Time Calculation (min) (ACUTE ONLY) 32 min  Subjective Data  Patient Stated Goal home soon, less pain with walking  Precautions  Precautions Knee  Precaution Comments KI worn for gait/stairs but able to do SLR independently afterwards  Required Braces or Orthoses Knee Immobilizer - Left  Knee Immobilizer - Left Discontinue once straight leg raise with < 10 degree lag  Restrictions  Other Position/Activity Restrictions WBAT  Pain Assessment  Pain Assessment 0-10  Pain Score 3  Pain Location L knee  Pain Descriptors / Indicators Sore  Pain Intervention(s) Limited activity within patient's tolerance;Monitored during session;Ice applied;Repositioned  Cognition  Arousal/Alertness Awake/alert  Behavior During Therapy WFL for tasks assessed/performed  Overall Cognitive Status Within Functional Limits for tasks assessed  Bed Mobility  Overal bed mobility Needs Assistance  Bed Mobility Supine to Sit;Sit to Supine  Supine to sit Supervision  Sit to supine Supervision  General bed mobility comments for safety and line management  Transfers  Overall transfer level Needs assistance  Equipment used Rolling walker (2 wheeled)  Transfers Sit to/from Stand  Sit to Stand Supervision  General transfer comment pt self corrects  Ambulation/Gait  Ambulation/Gait assistance Supervision;Min guard  Ambulation Distance (Feet) 40 Feet  Assistive device Rolling walker (2 wheeled)  Gait Pattern/deviations Step-to pattern  General Gait Details cues for sequence (initially) and posture; pt self corrects after first 10'  Stairs Yes  Stairs assistance Min guard  Stair Management One rail Right;One rail Left;Step to pattern;Sideways  Number of Stairs 5  General stair  comments cues for technique and sequence  Total Joint Exercises  Ankle Circles/Pumps AROM;Both;10 reps  Quad Sets AROM;Both;10 reps;Strengthening  Short Arc Quad AROM;Left;10 reps;Strengthening  Heel Slides AAROM;AROM;10 reps;Left  Hip ABduction/ADduction AROM;Strengthening;Left;10 reps  Straight Leg Raises AROM;AAROM;Left;Strengthening;10 reps  PT - End of Session  Equipment Utilized During Treatment Gait belt;Left knee immobilizer  Activity Tolerance Patient tolerated treatment well  Patient left in bed;with call bell/phone within reach;with bed alarm set  Nurse Communication Mobility status  PT - Assessment/Plan  PT Plan Current plan remains appropriate  PT Frequency (ACUTE ONLY) 7X/week  Follow Up Recommendations Home health PT  PT equipment None recommended by PT  PT Goal Progression  Progress towards PT goals Progressing toward goals  Acute Rehab PT Goals  PT Goal Formulation With patient  Time For Goal Achievement 08/06/15  Potential to Achieve Goals Good  PT General Charges  $$ ACUTE PT VISIT 1 Procedure  PT Treatments  $Gait Training 8-22 mins  $Therapeutic Exercise 8-22 mins

## 2015-08-31 NOTE — Discharge Summary (Signed)
Physician Discharge Summary   Patient ID: Stephanie Blake MRN: [id number] DOB/AGE: [birth year] 59 y.o.  Admit date: 08/02/2015 Discharge date: 08/03/2015  Primary Diagnosis:  Osteoarthritis Left knee(s)  Admission Diagnoses:  Past Medical History  Diagnosis Date  . Hyperlipidemia   . Arthritis     osteoarthritis- knees  . Lumbar herniated disc     L 1 L 2 did injections last injections dec 2015   Discharge Diagnoses:   Principal Problem:   OA (osteoarthritis) of knee  Estimated body mass index is 32.13 kg/(m^2) as calculated from the following:   Height as of this encounter: 5' 6" (1.676 m).   Weight as of this encounter: 90.266 kg (199 lb).  Procedure:  Procedure(s) (LRB): LEFT TOTAL KNEE ARTHROPLASTY (Left)   Consults: None  HPI: Stephanie Blake is a 59 y.o. year old female with end stage OA of her left knee with progressively worsening pain and dysfunction. She has constant pain, with activity and at rest and significant functional deficits with difficulties even with ADLs. She has had extensive non-op management including analgesics, injections of cortisone and viscosupplements, and home exercise program, but remains in significant pain with significant dysfunction. Radiographs show bone on bone arthritis medial and patellofemoral. She presents now for left Total Knee Arthroplasty.  Laboratory Data: Admission on 08/02/2015, Discharged on 08/03/2015  Component Date Value Ref Range Status  . WBC 08/03/2015 15.6* 4.0 - 10.5 K/uL Final  . RBC 08/03/2015 3.34* 3.87 - 5.11 MIL/uL Final  . Hemoglobin 08/03/2015 10.7* 12.0 - 15.0 g/dL Final  . HCT 08/03/2015 31.8* 36.0 - 46.0 % Final  . MCV 08/03/2015 95.2  78.0 - 100.0 fL Final  . MCH 08/03/2015 32.0  26.0 - 34.0 pg Final  . MCHC 08/03/2015 33.6  30.0 - 36.0 g/dL Final  . RDW 08/03/2015 14.5  11.5 - 15.5 % Final  . Platelets 08/03/2015 223  150 - 400 K/uL Final  . Sodium 08/03/2015 141  135 - 145 mmol/L Final  . Potassium  08/03/2015 3.8  3.5 - 5.1 mmol/L Final  . Chloride 08/03/2015 109  101 - 111 mmol/L Final  . CO2 08/03/2015 26  22 - 32 mmol/L Final  . Glucose, Bld 08/03/2015 278* 65 - 99 mg/dL Final  . BUN 08/03/2015 10  6 - 20 mg/dL Final  . Creatinine, Ser 08/03/2015 0.68  0.44 - 1.00 mg/dL Final  . Calcium 08/03/2015 8.0* 8.9 - 10.3 mg/dL Final  . GFR calc non Af Amer 08/03/2015 >60  >60 mL/min Final  . GFR calc Af Amer 08/03/2015 >60  >60 mL/min Final   Comment: (NOTE) The eGFR has been calculated using the CKD EPI equation. This calculation has not been validated in all clinical situations. eGFR's persistently <60 mL/min signify possible Chronic Kidney Disease.   Georgiann Hahn gap 08/03/2015 6  5 - 15 Final  Hospital Outpatient Visit on 07/26/2015  Component Date Value Ref Range Status  . aPTT 07/26/2015 27  24 - 37 seconds Final  . WBC 07/26/2015 7.4  4.0 - 10.5 K/uL Final  . RBC 07/26/2015 4.42  3.87 - 5.11 MIL/uL Final  . Hemoglobin 07/26/2015 13.7  12.0 - 15.0 g/dL Final  . HCT 07/26/2015 41.7  36.0 - 46.0 % Final  . MCV 07/26/2015 94.3  78.0 - 100.0 fL Final  . MCH 07/26/2015 31.0  26.0 - 34.0 pg Final  . MCHC 07/26/2015 32.9  30.0 - 36.0 g/dL Final  . RDW 07/26/2015 14.4  11.5 -  15.5 % Final  . Platelets 07/26/2015 239  150 - 400 K/uL Final  . Sodium 07/26/2015 141  135 - 145 mmol/L Final  . Potassium 07/26/2015 4.2  3.5 - 5.1 mmol/L Final  . Chloride 07/26/2015 107  101 - 111 mmol/L Final  . CO2 07/26/2015 27  22 - 32 mmol/L Final  . Glucose, Bld 07/26/2015 93  65 - 99 mg/dL Final  . BUN 07/26/2015 14  6 - 20 mg/dL Final  . Creatinine, Ser 07/26/2015 0.74  0.44 - 1.00 mg/dL Final  . Calcium 07/26/2015 8.9  8.9 - 10.3 mg/dL Final  . Total Protein 07/26/2015 7.0  6.5 - 8.1 g/dL Final  . Albumin 07/26/2015 4.0  3.5 - 5.0 g/dL Final  . AST 07/26/2015 19  15 - 41 U/L Final  . ALT 07/26/2015 18  14 - 54 U/L Final  . Alkaline Phosphatase 07/26/2015 52  38 - 126 U/L Final  . Total Bilirubin  07/26/2015 0.4  0.3 - 1.2 mg/dL Final  . GFR calc non Af Amer 07/26/2015 >60  >60 mL/min Final  . GFR calc Af Amer 07/26/2015 >60  >60 mL/min Final   Comment: (NOTE) The eGFR has been calculated using the CKD EPI equation. This calculation has not been validated in all clinical situations. eGFR's persistently <60 mL/min signify possible Chronic Kidney Disease.   . Anion gap 07/26/2015 7  5 - 15 Final  . Prothrombin Time 07/26/2015 12.3  11.6 - 15.2 seconds Final  . INR 07/26/2015 0.89  0.00 - 1.49 Final  . ABO/RH(D) 07/26/2015 A POS   Final  . Antibody Screen 07/26/2015 NEG   Final  . Sample Expiration 07/26/2015 08/05/2015   Final  . Color, Urine 07/26/2015 YELLOW  YELLOW Final  . APPearance 07/26/2015 CLEAR  CLEAR Final  . Specific Gravity, Urine 07/26/2015 1.010  1.005 - 1.030 Final  . pH 07/26/2015 6.5  5.0 - 8.0 Final  . Glucose, UA 07/26/2015 NEGATIVE  NEGATIVE mg/dL Final  . Hgb urine dipstick 07/26/2015 NEGATIVE  NEGATIVE Final  . Bilirubin Urine 07/26/2015 NEGATIVE  NEGATIVE Final  . Ketones, ur 07/26/2015 NEGATIVE  NEGATIVE mg/dL Final  . Protein, ur 07/26/2015 NEGATIVE  NEGATIVE mg/dL Final  . Urobilinogen, UA 07/26/2015 0.2  0.0 - 1.0 mg/dL Final  . Nitrite 07/26/2015 NEGATIVE  NEGATIVE Final  . Leukocytes, UA 07/26/2015 NEGATIVE  NEGATIVE Final   MICROSCOPIC NOT DONE ON URINES WITH NEGATIVE PROTEIN, BLOOD, LEUKOCYTES, NITRITE, OR GLUCOSE <1000 mg/dL.  Marland Kitchen MRSA, PCR 07/26/2015 NEGATIVE  NEGATIVE Final  . Staphylococcus aureus 07/26/2015 NEGATIVE  NEGATIVE Final   Comment:        The Xpert SA Assay (FDA approved for NASAL specimens in patients over 79 years of age), is one component of a comprehensive surveillance program.  Test performance has been validated by [facility] for patients greater than or equal to 31 year old. It is not intended to diagnose infection nor to guide or monitor treatment.      X-Rays:No results found.  EKG: Orders placed or performed  in visit on 12/07/10  . Converted [facility] EKG     Hospital Course: Stephanie Blake is a 59 y.o. who was admitted to [facility]. They were brought to the operating room on 08/02/2015 and underwent Procedure(s): LEFT TOTAL KNEE ARTHROPLASTY.  Patient tolerated the procedure well and was later transferred to the recovery room and then to the orthopaedic floor for postoperative care.  They were given PO and  IV analgesics for pain control following their surgery.  They were given 24 hours of postoperative antibiotics of  Anti-infectives    Start     Dose/Rate Route Frequency Ordered Stop   08/02/15 1400  ceFAZolin (ANCEF) IVPB 2 g/50 mL premix     2 g 100 mL/hr over 30 Minutes Intravenous Every 6 hours 08/02/15 1117 08/02/15 2041   08/02/15 0610  ceFAZolin (ANCEF) IVPB 2 g/50 mL premix     2 g 100 mL/hr over 30 Minutes Intravenous On call to O.R. 08/02/15 2992 08/02/15 0806     and started on DVT prophylaxis in the form of Xarelto.   PT and OT were ordered for total joint protocol.  Discharge planning consulted to help with postop disposition and equipment needs.  Patient had a tough night on the evening of surgery but doing better the next mornig.  They started to get up OOB with therapy on day one. Hemovac drain was pulled without difficulty.   Dressing was checked on day one and looked okay.   Patient was seen in rounds by Dr. Wynelle Link and was setup for discharge later that same day.  Did well with therapy and was ready to go home.  Discharge home with home health Diet - Cardiac diet Follow up - in 2 weeks Activity - WBAT Disposition - Home Condition Upon Discharge - pending D/C Meds - See DC Summary DVT Prophylaxis - Xarelto  Discharge Instructions    Call MD / Call 911    Complete by:  As directed   If you experience chest pain or shortness of breath, CALL 911 and be transported to the hospital emergency room.  If you develope a fever above 101 F, pus (white drainage) or increased  drainage or redness at the wound, or calf pain, call your surgeon's office.     Change dressing    Complete by:  As directed   Change dressing daily with sterile 4 x 4 inch gauze dressing and apply TED hose. Do not submerge the incision under water.     Constipation Prevention    Complete by:  As directed   Drink plenty of fluids.  Prune juice may be helpful.  You may use a stool softener, such as Colace (over the counter) 100 mg twice a day.  Use MiraLax (over the counter) for constipation as needed.     Diet - low sodium heart healthy    Complete by:  As directed      Discharge instructions    Complete by:  As directed   Pick up stool softner and laxative for home use following surgery while on pain medications. Do not submerge incision under water. May remove the surgical dressing tomorrow, Wednesday 08/04/2015, and then apply a dry gauze dressing daily. Please use good hand washing techniques while changing dressing each day. May shower starting three days after surgery starting on Thursday 08/05/2015. Please use a clean towel to pat the incision dry following showers. Continue to use ice for pain and swelling after surgery. Do not use any lotions or creams on the incision until instructed by your surgeon.  Postoperative Constipation Protocol  Constipation - defined medically as fewer than three stools per week and severe constipation as less than one stool per week.  One of the most common issues patients have following surgery is constipation. Even if you have a regular bowel pattern at home, your normal regimen is likely to be disrupted due to multiple reasons following surgery.  Combination of anesthesia, postoperative narcotics, change in appetite and fluid intake all can affect your bowels. In order to avoid complications following surgery, here are some recommendations in order to help you during your recovery period.  Colace (docusate) - Pick up an over-the-counter form of  Colace or another stool softener and take twice a day as Asfour as you are requiring postoperative pain medications. Take with a full glass of water daily. If you experience loose stools or diarrhea, hold the colace until you stool forms back up. If your symptoms do not get better within 1 week or if they get worse, check with your doctor.  Dulcolax (bisacodyl) - Pick up over-the-counter and take as directed by the product packaging as needed to assist with the movement of your bowels. Take with a full glass of water. Use this product as needed if not relieved by Colace only.   MiraLax (polyethylene glycol) - Pick up over-the-counter to have on hand. MiraLax is a solution that will increase the amount of water in your bowels to assist with bowel movements. Take as directed and can mix with a glass of water, juice, soda, coffee, or tea. Take if you go more than two days without a movement. Do not use MiraLax more than once per day. Call your doctor if you are still constipated or irregular after using this medication for 7 days in a row.  If you continue to have problems with postoperative constipation, please contact the office for further assistance and recommendations. If you experience "the worst abdominal pain ever" or develop nausea or vomiting, please contact the office immediatly for further recommendations for treatment.  Take Xarelto for two and a half more weeks, then discontinue Xarelto. Once the patient has completed the Xarelto, they may resume the 81 mg Aspirin.     Do not put a pillow under the knee. Place it under the heel.    Complete by:  As directed      Do not sit on low chairs, stoools or toilet seats, as it may be difficult to get up from low surfaces    Complete by:  As directed      Driving restrictions    Complete by:  As directed   No driving until released by the physician.     Increase activity slowly as tolerated    Complete by:  As directed      Lifting  restrictions    Complete by:  As directed   No lifting until released by the physician.     Patient may shower    Complete by:  As directed   You may shower without a dressing once there is no drainage.  Do not wash over the wound.  If drainage remains, do not shower until drainage stops.     TED hose    Complete by:  As directed   Use stockings (TED hose) for 3 weeks on both leg(s).  You may remove them at night for sleeping.     Weight bearing as tolerated    Complete by:  As directed   Laterality:  left  Extremity:  Lower            Medication List    STOP taking these medications        aspirin EC 81 MG tablet     glucosamine-chondroitin 500-400 MG tablet     predniSONE 10 MG tablet  Commonly known as:  Whatley these  medications        atorvastatin 20 MG tablet  Commonly known as:  LIPITOR  Take 20 mg by mouth every evening.     eszopiclone 3 MG Tabs  Generic drug:  Eszopiclone  Take 3 mg by mouth at bedtime. Take immediately before bedtime     gabapentin 300 MG capsule  Commonly known as:  NEURONTIN  TAKE 1 CAPSULE (300 MG TOTAL) BY MOUTH AT BEDTIME.     gabapentin 100 MG capsule  Commonly known as:  NEURONTIN  TAKE 1 CAPSULE TWICE A DAY     methocarbamol 500 MG tablet  Commonly known as:  ROBAXIN  Take 1 tablet (500 mg total) by mouth every 6 (six) hours as needed for muscle spasms.     oxyCODONE 5 MG immediate release tablet  Commonly known as:  Oxy IR/ROXICODONE  Take 1-2 tablets (5-10 mg total) by mouth every 3 (three) hours as needed for moderate pain or severe pain.     rivaroxaban 10 MG Tabs tablet  Commonly known as:  XARELTO  Take 1 tablet (10 mg total) by mouth daily with breakfast. Take Xarelto for two and a half more weeks, then discontinue Xarelto. Once the patient has completed the Xarelto, they may resume the 81 mg Aspirin.     traMADol 50 MG tablet  Commonly known as:  ULTRAM  Take 1-2 tablets (50-100 mg total) by mouth  every 6 (six) hours as needed (mild pain).           Follow-up Information    Follow up with Gearlean Alf, MD. Schedule an appointment as soon as possible for a visit on 08/17/2015.   Specialty:  Orthopedic Surgery   Why:  Call office at [phone] to setup appointment on Tuesday 08/17/2015 with Dr. Wynelle Link.   Contact information:   [address] [place] [address] [phone]       Follow up with [facility].   Why:  Sharyn Lull has been requested as your physical therapist   Contact information:   [address] [place] [id number] [phone]       Signed: Arlee Muslim, PA-C Orthopaedic Surgery 08/31/2015, 9:29 AM

## 2015-12-21 DIAGNOSIS — H2513 Age-related nuclear cataract, bilateral: Secondary | ICD-10-CM | POA: Diagnosis not present

## 2015-12-21 DIAGNOSIS — H5203 Hypermetropia, bilateral: Secondary | ICD-10-CM | POA: Diagnosis not present

## 2015-12-21 DIAGNOSIS — H52223 Regular astigmatism, bilateral: Secondary | ICD-10-CM | POA: Diagnosis not present

## 2015-12-21 DIAGNOSIS — H26491 Other secondary cataract, right eye: Secondary | ICD-10-CM | POA: Diagnosis not present

## 2015-12-21 DIAGNOSIS — H524 Presbyopia: Secondary | ICD-10-CM | POA: Diagnosis not present

## 2015-12-21 DIAGNOSIS — H26041 Anterior subcapsular polar infantile and juvenile cataract, right eye: Secondary | ICD-10-CM | POA: Diagnosis not present

## 2016-02-02 DIAGNOSIS — E7800 Pure hypercholesterolemia, unspecified: Secondary | ICD-10-CM | POA: Diagnosis not present

## 2016-02-10 DIAGNOSIS — H1045 Other chronic allergic conjunctivitis: Secondary | ICD-10-CM | POA: Diagnosis not present

## 2016-02-29 ENCOUNTER — Ambulatory Visit (INDEPENDENT_AMBULATORY_CARE_PROVIDER_SITE_OTHER): Payer: Self-pay | Admitting: Allergy and Immunology

## 2016-02-29 ENCOUNTER — Encounter: Payer: Self-pay | Admitting: Allergy and Immunology

## 2016-02-29 VITALS — BP 118/70 | HR 76 | Temp 97.7°F | Resp 16 | Ht 64.17 in | Wt 193.1 lb

## 2016-02-29 DIAGNOSIS — H1010 Acute atopic conjunctivitis, unspecified eye: Secondary | ICD-10-CM | POA: Insufficient documentation

## 2016-02-29 DIAGNOSIS — H1045 Other chronic allergic conjunctivitis: Secondary | ICD-10-CM

## 2016-02-29 DIAGNOSIS — J3089 Other allergic rhinitis: Secondary | ICD-10-CM | POA: Diagnosis not present

## 2016-02-29 MED ORDER — LEVOCETIRIZINE DIHYDROCHLORIDE 5 MG PO TABS: 5.0000 mg | ORAL_TABLET | Freq: Every evening | ORAL | Status: AC

## 2016-02-29 MED ORDER — OLOPATADINE HCL 0.7 % OP SOLN: 1.0000 [drp] | OPHTHALMIC | Status: AC

## 2016-02-29 NOTE — Assessment & Plan Note (Addendum)
Given the timing of symptom onset and aeroallergen sensitization, we will treat presumptively for seasonal and perennial allergic conjunctivitis.  However, the lack of ocular pruritus and minimal nasal symptoms suggest the possibility of another etiology.  Aeroallergen avoidance measures have been discussed and provided in written form.  A prescription has been provided for levocetirizine, 5 mg daily as needed.  A prescription has been provided for Pazeo, one drop per eye daily as needed.  I have also recommended eye lubricant drops (i.e., Natural Tears) as needed.  If symptoms persist or progress despite compliance with the recommendations listed above, consider follow up with ophthalmologist for further evaluation as this would suggest a nonallergic etiology.

## 2016-02-29 NOTE — Patient Instructions (Addendum)
Conjunctivitis Given the timing of symptom onset and aeroallergen sensitization, we will treat presumptively for seasonal and perennial allergic conjunctivitis.  However, the lack of ocular pruritus and minimal nasal symptoms suggest the possibility of another etiology.  Aeroallergen avoidance measures have been discussed and provided in written form.  A prescription has been provided for levocetirizine, 5 mg daily as needed.  A prescription has been provided for Pazeo, one drop per eye daily as needed.  I have also recommended eye lubricant drops (i.e., Natural Tears) as needed.  If symptoms persist or progress despite compliance with the recommendations listed above, consider follow up with ophthalmologist for further evaluation as this would suggest a nonallergic etiology.  Allergic rhinitis  Allergen avoidance measures.  A prescription has been provided for levocetirizine (as above).   Return if symptoms worsen or fail to improve.  Reducing Pollen Exposure  The American Academy of Allergy, Asthma and Immunology suggests the following steps to reduce your exposure to pollen during allergy seasons.    1. Do not hang sheets or clothing out to dry; pollen may collect on these items. 2. Do not mow lawns or spend time around freshly cut grass; mowing stirs up pollen. 3. Keep windows closed at night.  Keep car windows closed while driving. 4. Minimize morning activities outdoors, a time when pollen counts are usually at their highest. 5. Stay indoors as much as possible when pollen counts or humidity is high and on windy days when pollen tends to remain in the air longer. 6. Use air conditioning when possible.  Many air conditioners have filters that trap the pollen spores. 7. Use a HEPA room air filter to remove pollen form the indoor air you breathe.   Control of Mold Allergen  Mold and fungi can grow on a variety of surfaces provided certain temperature and moisture conditions  exist.  Outdoor molds grow on plants, decaying vegetation and soil.  The major outdoor mold, Alternaria and Cladosporium, are found in very high numbers during hot and dry conditions.  Generally, a late Summer - Fall peak is seen for common outdoor fungal spores.  Rain will temporarily lower outdoor mold spore count, but counts rise rapidly when the rainy period ends.  The most important indoor molds are Aspergillus and Penicillium.  Dark, humid and poorly ventilated basements are ideal sites for mold growth.  The next most common sites of mold growth are the bathroom and the kitchen.  Outdoor [employer] 3. Use air conditioning and keep windows closed 4. Avoid exposure to decaying vegetation. 5. Avoid leaf raking. 6. Avoid grain handling. 7. Consider wearing a face mask if working in moldy areas.  Indoor Mold Control 1. Maintain humidity below 50%. 2. Clean washable surfaces with 5% bleach solution. 3. Remove sources e.g. Contaminated carpets.  Control of House Dust Mite Allergen  House dust mites play a major role in allergic asthma and rhinitis.  They occur in environments with high humidity wherever human skin, the food for dust mites is found. High levels have been detected in dust obtained from mattresses, pillows, carpets, upholstered furniture, bed covers, clothes and soft toys.  The principal allergen of the house dust mite is found in its feces.  A gram of dust may contain 1,000 mites and 250,000 fecal particles.  Mite antigen is easily measured in the air during house cleaning activities.    1. Encase mattresses, including the box spring, and pillow, in an air tight cover.  Seal the zipper end of  the encased mattresses with wide adhesive tape. 2. Wash the bedding in water of 130 degrees Farenheit weekly.  Avoid cotton comforters/quilts and flannel bedding: the most ideal bed covering is the dacron comforter. 3. Remove all upholstered furniture from the bedroom. 4. Remove carpets,  carpet padding, rugs, and non-washable window drapes from the bedroom.  Wash drapes weekly or use plastic window coverings. 5. Remove all non-washable stuffed toys from the bedroom.  Wash stuffed toys weekly. 6. Have the room cleaned frequently with a vacuum cleaner and a damp dust-mop.  The patient should not be in a room which is being cleaned and should wait 1 hour after cleaning before going into the room. 7. Close and seal all heating outlets in the bedroom.  Otherwise, the room will become filled with dust-laden air.  An electric heater can be used to heat the room. 8. Reduce indoor humidity to less than 50%.  Do not use a humidifier.  Control of Dog or Cat Allergen  Avoidance is the best way to manage a dog or cat allergy. If you have a dog or cat and are allergic to dog or cats, consider removing the dog or cat from the home. If you have a dog or cat but don't want to find it a new home, or if your family wants a pet even though someone in the household is allergic, here are some strategies that may help keep symptoms at bay:  1. Keep the pet out of your bedroom and restrict it to only a few rooms. Be advised that keeping the dog or cat in only one room will not limit the allergens to that room. 2. Don't pet, hug or kiss the dog or cat; if you do, wash your hands with soap and water. 3. High-efficiency particulate air (HEPA) cleaners run continuously in a bedroom or living room can reduce allergen levels over time. 4. Regular use of a high-efficiency vacuum cleaner or a central vacuum can reduce allergen levels. 5. Giving your dog or cat a bath at least once a week can reduce airborne allergen.

## 2016-02-29 NOTE — Progress Notes (Signed)
New Patient Note  RE: Stephanie Blake MRN: [id number] DOB: 07-20-1956 Date of Office Visit: 02/29/2016  Referring provider: Lennie Odor, PA-C Primary care provider: REDMON,NOELLE, PA-C  Chief Complaint: Conjunctivitis   History of present illness: HPI Comments: Stephanie Blake is a 60 y.o. female presenting today for consultation of conjunctivitis.  She states that approximately 8 weeks ago she woke up with red, puffy, draining eyes, left greater than right.  She went to her ophthalmologist who believed that it was allergy related and recommended Zaditor.  She tried this Zaditor without perceived symptom relief.  She was then started on an ophthalmic steroid drop for 2 weeks without perceived benefit.  She was offered a course of oral corticosteroids but refused.  She denies significant ocular pruritus or significant nasal symptoms.   Assessment and plan: Conjunctivitis Given the timing of symptom onset and aeroallergen sensitization, we will treat presumptively for seasonal and perennial allergic conjunctivitis.  However, the lack of ocular pruritus and minimal nasal symptoms suggest the possibility of another etiology.  Aeroallergen avoidance measures have been discussed and provided in written form.  A prescription has been provided for levocetirizine, 5 mg daily as needed.  A prescription has been provided for Pazeo, one drop per eye daily as needed.  I have also recommended eye lubricant drops (i.e., Natural Tears) as needed.  If symptoms persist or progress despite compliance with the recommendations listed above, consider follow up with ophthalmologist for further evaluation as this would suggest a nonallergic etiology.  Allergic rhinitis  Allergen avoidance measures.  A prescription has been provided for levocetirizine (as above).    Meds ordered this encounter  Medications  . levocetirizine (XYZAL) 5 MG tablet    Sig: Take 1 tablet (5 mg total) by mouth every evening.      Dispense:  30 tablet    Refill:  5  . Olopatadine HCl (PAZEO) 0.7 % SOLN    Sig: Place 1 drop into both eyes 1 day or 1 dose.    Dispense:  1 Bottle    Refill:  5    Diagnositics: Epicutaneous testing: Positive to tree pollens, ragweed pollen, weed pollens, cat hair, and dust mite antigen. Intradermal testing: Positive to molds, dog epithelia, and cockroach antigen.    Physical examination: Blood pressure 118/70, pulse 76, temperature 97.7 F (36.5 C), temperature source Oral, resp. rate 16, height 5' 4.17" (1.63 m), weight 193 lb 2 oz (87.6 kg).  General: Alert, interactive, in no acute distress. HEENT: TMs pearly gray, turbinates mildly edematous without discharge, post-pharynx unremarkable.  Injection of the conjunctivae, left greater than right.  Mild periorbital edema, left greater than right. Neck: Supple without lymphadenopathy. Lungs: Clear to auscultation without wheezing, rhonchi or rales. CV: Normal S1, S2 without murmurs. Abdomen: Nondistended, nontender. Skin: Warm and dry, without lesions or rashes. Extremities:  No clubbing, cyanosis or edema. Neuro:   Grossly intact.  Review of systems:  Review of Systems  Constitutional: Negative for fever, chills and weight loss.  HENT: Negative for nosebleeds.   Eyes: Positive for discharge and redness. Negative for blurred vision.  Respiratory: Negative for cough, hemoptysis, shortness of breath and wheezing.   Cardiovascular: Negative for chest pain.  Gastrointestinal: Negative for diarrhea and constipation.  Genitourinary: Negative for dysuria.  Musculoskeletal: Negative for myalgias and joint pain.  Skin: Negative for itching and rash.  Neurological: Negative for dizziness.  Endo/Heme/Allergies: Does not bruise/bleed easily.    Past medical history:  Past Medical History  Diagnosis Date  . Hyperlipidemia   . Arthritis     osteoarthritis- knees  . Lumbar herniated disc     L 1 L 2 did injections last  injections dec 2015    Past surgical history:  Past Surgical History  Procedure Laterality Date  . Dilation and curettage of uterus      s/p miscarriage-30 yrs ago  . Tonsillectomy      childhood  . 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 Total knee arthroplasty Left 08/02/2015    Procedure: LEFT TOTAL KNEE ARTHROPLASTY;  Surgeon: Gaynelle Arabian, MD;  Location: [facility];  Service: Orthopedics;  Laterality: Left;    Family history: Family History  Problem Relation Age of Onset  . Breast cancer Mother   . Alcohol abuse Father   . Cancer Neg Hx   . Diabetes Neg Hx   . Early death Neg Hx   . Heart disease Neg Hx   . Hyperlipidemia Neg Hx   . Stroke Neg Hx   . Allergic rhinitis Neg Hx   . Angioedema Neg Hx   . Asthma Neg Hx   . Eczema Neg Hx   . Immunodeficiency Neg Hx   . Urticaria Neg Hx     Social history: Social History   Social History  . Marital Status: Married    Spouse Name: N/A  . Number of Children: N/A  . Years of Education: N/A   Occupational History  . Not on file.   Social History Main Topics  . Smoking status: Former Smoker -- 0.50 packs/day for 20 years  . Smokeless tobacco: Former Systems developer    Quit date: 10/02/1976  . Alcohol Use: 0.0 oz/week    0 Standard drinks or equivalent per week     Comment: wine occ. rare  . Drug Use: No  . Sexual Activity:    Partners: Male   Other Topics Concern  . Not on file   Social History Narrative   Environmental History: The patient lives in a 62 year old house with hardwood floors throughout, gas heat, and central air.  There is a dog in house which does not have access to her bedroom.  She is a nonsmoker.    Medication List       This list is accurate as of: 02/29/16  9:51 PM.  Always use your most recent med list.               aspirin 81 MG  tablet  Take 81 mg by mouth daily.     atorvastatin 20 MG tablet  Commonly known as:  LIPITOR  Take 20 mg by mouth every evening. Reported on 02/29/2016     CRESTOR 10 MG tablet  Generic drug:  rosuvastatin  Take 10 mg by mouth daily.     eszopiclone 3 MG Tabs  Generic drug:  Eszopiclone  Take 3 mg by mouth at bedtime. Take immediately before bedtime     gabapentin 300 MG capsule  Commonly known as:  NEURONTIN  TAKE 1 CAPSULE (300 MG TOTAL) BY MOUTH AT BEDTIME.     gabapentin 100 MG capsule  Commonly known as:  NEURONTIN  TAKE 1 CAPSULE TWICE A DAY     levocetirizine 5 MG tablet  Commonly known as:  XYZAL  Take 1 tablet (5 mg total) by mouth every evening.  methocarbamol 500 MG tablet  Commonly known as:  ROBAXIN  Take 1 tablet (500 mg total) by mouth every 6 (six) hours as needed for muscle spasms.     Olopatadine HCl 0.7 % Soln  Commonly known as:  PAZEO  Place 1 drop into both eyes 1 day or 1 dose.     oxyCODONE 5 MG immediate release tablet  Commonly known as:  Oxy IR/ROXICODONE  Take 1-2 tablets (5-10 mg total) by mouth every 3 (three) hours as needed for moderate pain or severe pain.     rivaroxaban 10 MG Tabs tablet  Commonly known as:  XARELTO  Take 1 tablet (10 mg total) by mouth daily with breakfast. Take Xarelto for two and a half more weeks, then discontinue Xarelto. Once the patient has completed the Xarelto, they may resume the 81 mg Aspirin.     traMADol 50 MG tablet  Commonly known as:  ULTRAM  Take 1-2 tablets (50-100 mg total) by mouth every 6 (six) hours as needed (mild pain).        Known medication allergies: Allergies  Allergen Reactions  . Lipitor [Atorvastatin] Other (See Comments)    Muscle Aches    I appreciate the opportunity to take part in this [place]'s care. Please do not hesitate to contact me with questions.  Sincerely,   R. Edgar Frisk, MD

## 2016-02-29 NOTE — Assessment & Plan Note (Signed)
   Allergen avoidance measures.  A prescription has been provided for levocetirizine (as above).

## 2016-03-28 DIAGNOSIS — H10412 Chronic giant papillary conjunctivitis, left eye: Secondary | ICD-10-CM | POA: Diagnosis not present

## 2016-04-05 ENCOUNTER — Other Ambulatory Visit: Payer: Self-pay | Admitting: Ophthalmology

## 2016-04-05 DIAGNOSIS — H02846 Edema of left eye, unspecified eyelid: Secondary | ICD-10-CM

## 2016-04-05 DIAGNOSIS — H11422 Conjunctival edema, left eye: Secondary | ICD-10-CM | POA: Diagnosis not present

## 2016-04-05 DIAGNOSIS — H05223 Edema of bilateral orbit: Secondary | ICD-10-CM

## 2016-04-11 ENCOUNTER — Other Ambulatory Visit: Payer: Self-pay | Admitting: Ophthalmology

## 2016-04-11 DIAGNOSIS — H02846 Edema of left eye, unspecified eyelid: Secondary | ICD-10-CM

## 2016-04-11 DIAGNOSIS — H05223 Edema of bilateral orbit: Secondary | ICD-10-CM

## 2016-04-11 DIAGNOSIS — D496 Neoplasm of unspecified behavior of brain: Secondary | ICD-10-CM

## 2016-04-12 ENCOUNTER — Ambulatory Visit
Admission: RE | Admit: 2016-04-12 | Discharge: 2016-04-12 | Disposition: A | Discharge status: Home / Self Care | Payer: 59 | Source: Ambulatory Visit | Attending: Ophthalmology | Admitting: Ophthalmology

## 2016-04-12 DIAGNOSIS — H02846 Edema of left eye, unspecified eyelid: Secondary | ICD-10-CM

## 2016-04-12 DIAGNOSIS — H05223 Edema of bilateral orbit: Secondary | ICD-10-CM

## 2016-04-12 DIAGNOSIS — D496 Neoplasm of unspecified behavior of brain: Secondary | ICD-10-CM

## 2016-04-12 MED ORDER — IOPAMIDOL (ISOVUE-300) INJECTION 61%
75.0000 mL | Freq: Once | INTRAVENOUS | Status: AC | PRN
  Administered 2016-04-12: 75 mL via INTRAVENOUS

## 2016-04-18 DIAGNOSIS — Z6831 Body mass index (BMI) 31.0-31.9, adult: Secondary | ICD-10-CM | POA: Diagnosis not present

## 2016-04-18 DIAGNOSIS — Z01419 Encounter for gynecological examination (general) (routine) without abnormal findings: Secondary | ICD-10-CM | POA: Diagnosis not present

## 2016-04-20 DIAGNOSIS — Z809 Family history of malignant neoplasm, unspecified: Secondary | ICD-10-CM | POA: Diagnosis not present

## 2016-04-20 DIAGNOSIS — Z79899 Other long term (current) drug therapy: Secondary | ICD-10-CM | POA: Diagnosis not present

## 2016-04-20 DIAGNOSIS — Z7982 Long term (current) use of aspirin: Secondary | ICD-10-CM | POA: Diagnosis not present

## 2016-04-20 DIAGNOSIS — Z96653 Presence of artificial knee joint, bilateral: Secondary | ICD-10-CM | POA: Diagnosis not present

## 2016-04-20 DIAGNOSIS — H2513 Age-related nuclear cataract, bilateral: Secondary | ICD-10-CM | POA: Diagnosis not present

## 2016-04-20 DIAGNOSIS — Z87891 Personal history of nicotine dependence: Secondary | ICD-10-CM | POA: Diagnosis not present

## 2016-04-20 DIAGNOSIS — H02846 Edema of left eye, unspecified eyelid: Secondary | ICD-10-CM | POA: Diagnosis not present

## 2016-04-26 DIAGNOSIS — Z13820 Encounter for screening for osteoporosis: Secondary | ICD-10-CM | POA: Diagnosis not present

## 2016-05-19 DIAGNOSIS — Z0000 Encounter for general adult medical examination without abnormal findings: Secondary | ICD-10-CM | POA: Diagnosis not present

## 2016-05-19 DIAGNOSIS — E7800 Pure hypercholesterolemia, unspecified: Secondary | ICD-10-CM | POA: Diagnosis not present

## 2016-05-23 DIAGNOSIS — H02846 Edema of left eye, unspecified eyelid: Secondary | ICD-10-CM | POA: Diagnosis not present

## 2016-05-23 DIAGNOSIS — E0500 Thyrotoxicosis with diffuse goiter without thyrotoxic crisis or storm: Secondary | ICD-10-CM | POA: Diagnosis not present

## 2016-05-25 DIAGNOSIS — E0500 Thyrotoxicosis with diffuse goiter without thyrotoxic crisis or storm: Secondary | ICD-10-CM | POA: Diagnosis not present

## 2016-06-01 DIAGNOSIS — E0500 Thyrotoxicosis with diffuse goiter without thyrotoxic crisis or storm: Secondary | ICD-10-CM | POA: Diagnosis not present

## 2016-07-11 DIAGNOSIS — E0500 Thyrotoxicosis with diffuse goiter without thyrotoxic crisis or storm: Secondary | ICD-10-CM | POA: Diagnosis not present

## 2016-07-13 DIAGNOSIS — E0500 Thyrotoxicosis with diffuse goiter without thyrotoxic crisis or storm: Secondary | ICD-10-CM | POA: Diagnosis not present

## 2016-07-18 DIAGNOSIS — H02844 Edema of left upper eyelid: Secondary | ICD-10-CM | POA: Diagnosis not present

## 2016-07-18 DIAGNOSIS — H02841 Edema of right upper eyelid: Secondary | ICD-10-CM | POA: Diagnosis not present

## 2016-07-18 DIAGNOSIS — E0500 Thyrotoxicosis with diffuse goiter without thyrotoxic crisis or storm: Secondary | ICD-10-CM | POA: Diagnosis not present

## 2016-07-27 DIAGNOSIS — Z96652 Presence of left artificial knee joint: Secondary | ICD-10-CM | POA: Diagnosis not present

## 2016-07-27 DIAGNOSIS — E0500 Thyrotoxicosis with diffuse goiter without thyrotoxic crisis or storm: Secondary | ICD-10-CM | POA: Diagnosis not present

## 2016-07-27 DIAGNOSIS — Z471 Aftercare following joint replacement surgery: Secondary | ICD-10-CM | POA: Diagnosis not present

## 2016-08-11 DIAGNOSIS — E0500 Thyrotoxicosis with diffuse goiter without thyrotoxic crisis or storm: Secondary | ICD-10-CM | POA: Diagnosis not present

## 2016-08-21 MED FILL — methIMAzole 10 MG TABS: 10 | 90 days supply | Qty: 90 | Fill #0 | Status: TO

## 2016-09-08 DIAGNOSIS — E0500 Thyrotoxicosis with diffuse goiter without thyrotoxic crisis or storm: Secondary | ICD-10-CM | POA: Diagnosis not present

## 2016-09-12 DIAGNOSIS — E0500 Thyrotoxicosis with diffuse goiter without thyrotoxic crisis or storm: Secondary | ICD-10-CM | POA: Diagnosis not present

## 2016-11-07 DIAGNOSIS — H02846 Edema of left eye, unspecified eyelid: Secondary | ICD-10-CM | POA: Diagnosis not present

## 2016-11-07 DIAGNOSIS — H532 Diplopia: Secondary | ICD-10-CM | POA: Diagnosis not present

## 2016-11-07 DIAGNOSIS — E0500 Thyrotoxicosis with diffuse goiter without thyrotoxic crisis or storm: Secondary | ICD-10-CM | POA: Diagnosis not present

## 2016-11-27 DIAGNOSIS — E0500 Thyrotoxicosis with diffuse goiter without thyrotoxic crisis or storm: Secondary | ICD-10-CM | POA: Diagnosis not present

## 2016-12-01 DIAGNOSIS — E0500 Thyrotoxicosis with diffuse goiter without thyrotoxic crisis or storm: Secondary | ICD-10-CM | POA: Diagnosis not present

## 2016-12-05 ENCOUNTER — Other Ambulatory Visit: Payer: Self-pay | Admitting: Obstetrics and Gynecology

## 2016-12-05 DIAGNOSIS — Z1231 Encounter for screening mammogram for malignant neoplasm of breast: Secondary | ICD-10-CM

## 2016-12-12 ENCOUNTER — Ambulatory Visit
Admission: RE | Admit: 2016-12-12 | Discharge: 2016-12-12 | Disposition: A | Discharge status: Home / Self Care | Payer: BLUE CROSS/BLUE SHIELD | Source: Ambulatory Visit | Attending: Obstetrics and Gynecology | Admitting: Obstetrics and Gynecology

## 2016-12-12 DIAGNOSIS — Z1231 Encounter for screening mammogram for malignant neoplasm of breast: Secondary | ICD-10-CM | POA: Diagnosis not present

## 2017-01-09 DIAGNOSIS — H02846 Edema of left eye, unspecified eyelid: Secondary | ICD-10-CM | POA: Diagnosis not present

## 2017-01-09 DIAGNOSIS — E0500 Thyrotoxicosis with diffuse goiter without thyrotoxic crisis or storm: Secondary | ICD-10-CM | POA: Diagnosis not present

## 2017-01-09 DIAGNOSIS — H532 Diplopia: Secondary | ICD-10-CM | POA: Diagnosis not present

## 2017-01-29 DIAGNOSIS — L821 Other seborrheic keratosis: Secondary | ICD-10-CM | POA: Diagnosis not present

## 2017-01-29 DIAGNOSIS — E0500 Thyrotoxicosis with diffuse goiter without thyrotoxic crisis or storm: Secondary | ICD-10-CM | POA: Diagnosis not present

## 2017-02-02 DIAGNOSIS — E0500 Thyrotoxicosis with diffuse goiter without thyrotoxic crisis or storm: Secondary | ICD-10-CM | POA: Diagnosis not present

## 2017-03-06 DIAGNOSIS — Z87891 Personal history of nicotine dependence: Secondary | ICD-10-CM | POA: Diagnosis not present

## 2017-03-06 DIAGNOSIS — E0500 Thyrotoxicosis with diffuse goiter without thyrotoxic crisis or storm: Secondary | ICD-10-CM | POA: Diagnosis not present

## 2017-03-06 DIAGNOSIS — H5021 Vertical strabismus, right eye: Secondary | ICD-10-CM | POA: Diagnosis not present

## 2017-03-06 DIAGNOSIS — H532 Diplopia: Secondary | ICD-10-CM | POA: Diagnosis not present

## 2017-03-06 DIAGNOSIS — H02539 Eyelid retraction unspecified eye, unspecified lid: Secondary | ICD-10-CM | POA: Diagnosis not present

## 2017-03-06 DIAGNOSIS — H2513 Age-related nuclear cataract, bilateral: Secondary | ICD-10-CM | POA: Diagnosis not present

## 2017-03-06 DIAGNOSIS — H02846 Edema of left eye, unspecified eyelid: Secondary | ICD-10-CM | POA: Diagnosis not present

## 2017-03-21 DIAGNOSIS — E0500 Thyrotoxicosis with diffuse goiter without thyrotoxic crisis or storm: Secondary | ICD-10-CM | POA: Diagnosis not present

## 2017-03-21 DIAGNOSIS — Z7952 Long term (current) use of systemic steroids: Secondary | ICD-10-CM | POA: Diagnosis not present

## 2017-03-28 DIAGNOSIS — E0500 Thyrotoxicosis with diffuse goiter without thyrotoxic crisis or storm: Secondary | ICD-10-CM | POA: Diagnosis not present

## 2017-03-28 DIAGNOSIS — Z7952 Long term (current) use of systemic steroids: Secondary | ICD-10-CM | POA: Diagnosis not present

## 2017-04-02 DIAGNOSIS — E0500 Thyrotoxicosis with diffuse goiter without thyrotoxic crisis or storm: Secondary | ICD-10-CM | POA: Diagnosis not present

## 2017-04-03 DIAGNOSIS — E0500 Thyrotoxicosis with diffuse goiter without thyrotoxic crisis or storm: Secondary | ICD-10-CM | POA: Diagnosis not present

## 2017-04-05 DIAGNOSIS — E0500 Thyrotoxicosis with diffuse goiter without thyrotoxic crisis or storm: Secondary | ICD-10-CM | POA: Diagnosis not present

## 2017-04-11 DIAGNOSIS — E0500 Thyrotoxicosis with diffuse goiter without thyrotoxic crisis or storm: Secondary | ICD-10-CM | POA: Diagnosis not present

## 2017-04-11 DIAGNOSIS — Z7952 Long term (current) use of systemic steroids: Secondary | ICD-10-CM | POA: Diagnosis not present

## 2017-04-16 DIAGNOSIS — H5021 Vertical strabismus, right eye: Secondary | ICD-10-CM | POA: Diagnosis not present

## 2017-04-16 DIAGNOSIS — H02846 Edema of left eye, unspecified eyelid: Secondary | ICD-10-CM | POA: Diagnosis not present

## 2017-04-16 DIAGNOSIS — H532 Diplopia: Secondary | ICD-10-CM | POA: Diagnosis not present

## 2017-04-16 DIAGNOSIS — E0500 Thyrotoxicosis with diffuse goiter without thyrotoxic crisis or storm: Secondary | ICD-10-CM | POA: Diagnosis not present

## 2017-04-16 DIAGNOSIS — H02539 Eyelid retraction unspecified eye, unspecified lid: Secondary | ICD-10-CM | POA: Diagnosis not present

## 2017-04-24 DIAGNOSIS — E0500 Thyrotoxicosis with diffuse goiter without thyrotoxic crisis or storm: Secondary | ICD-10-CM | POA: Diagnosis not present

## 2017-04-24 DIAGNOSIS — H5021 Vertical strabismus, right eye: Secondary | ICD-10-CM | POA: Diagnosis not present

## 2017-05-22 DIAGNOSIS — Z0000 Encounter for general adult medical examination without abnormal findings: Secondary | ICD-10-CM | POA: Diagnosis not present

## 2017-05-22 DIAGNOSIS — E7800 Pure hypercholesterolemia, unspecified: Secondary | ICD-10-CM | POA: Diagnosis not present

## 2017-05-23 IMAGING — MG 2D DIGITAL SCREENING BILATERAL MAMMOGRAM WITH CAD AND ADJUNCT TO
8 of 12 series · 8 of 28 positions shown · non-contrast
Comparison: Previous exam(s).

CLINICAL DATA: Screening.

EXAM:
2D DIGITAL SCREENING BILATERAL MAMMOGRAM WITH CAD AND ADJUNCT TOMO

[L MLO synth-2D]
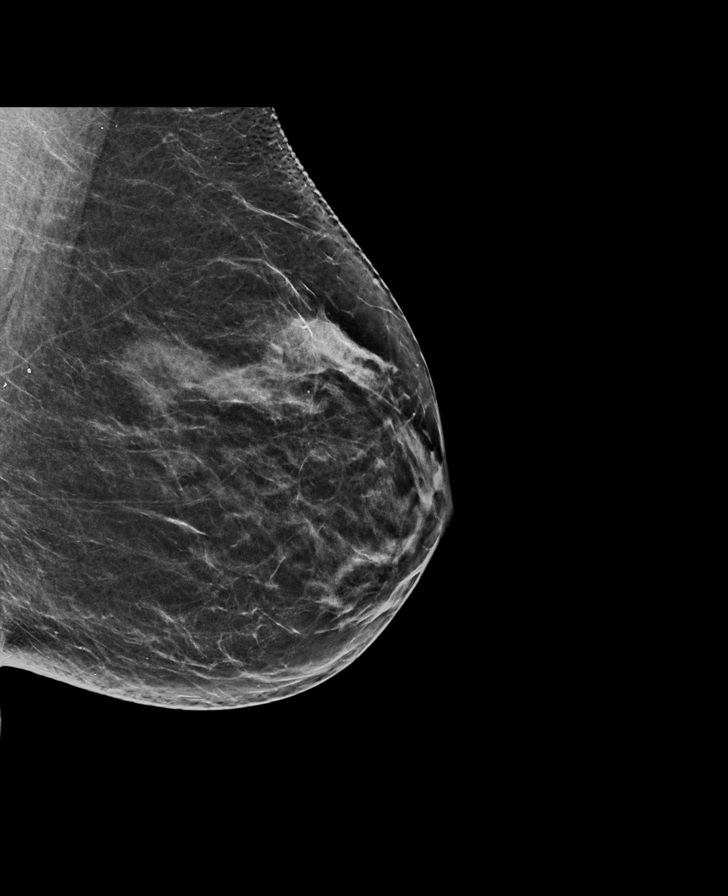

[R MLO]
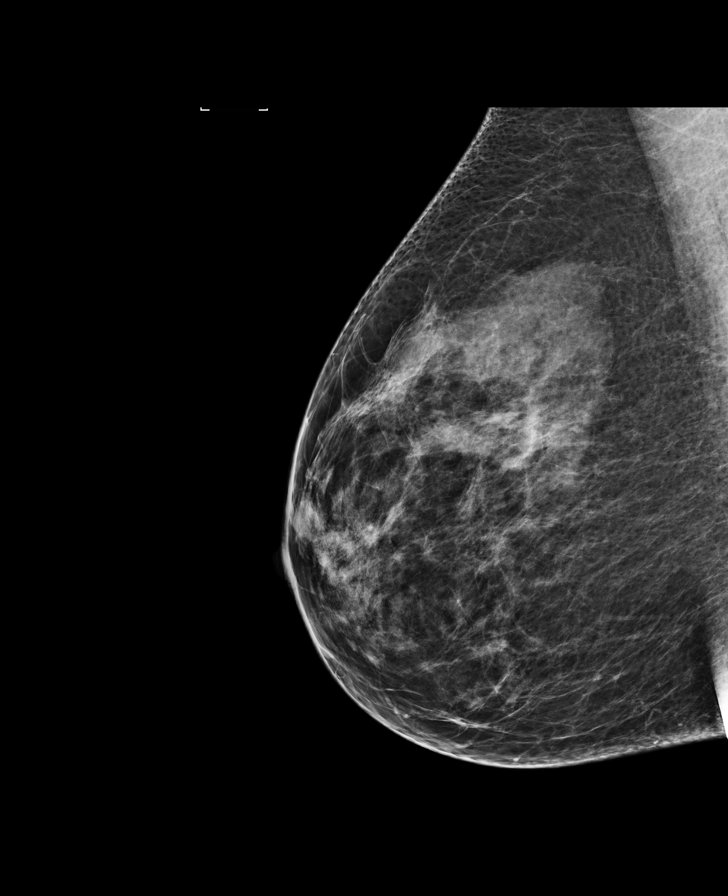

[R MLO synth-2D]
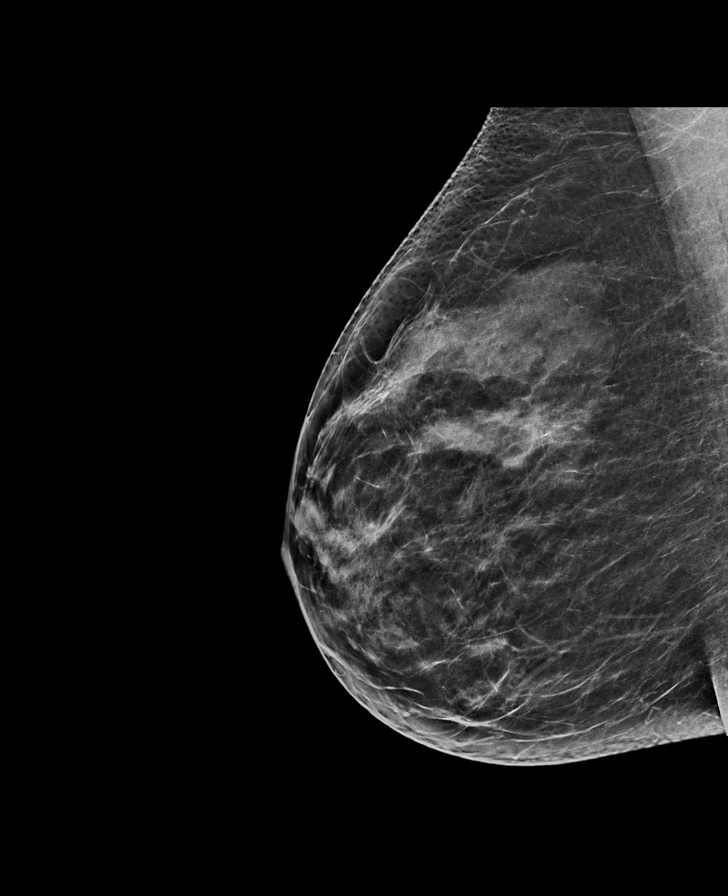

[L CC synth-2D]
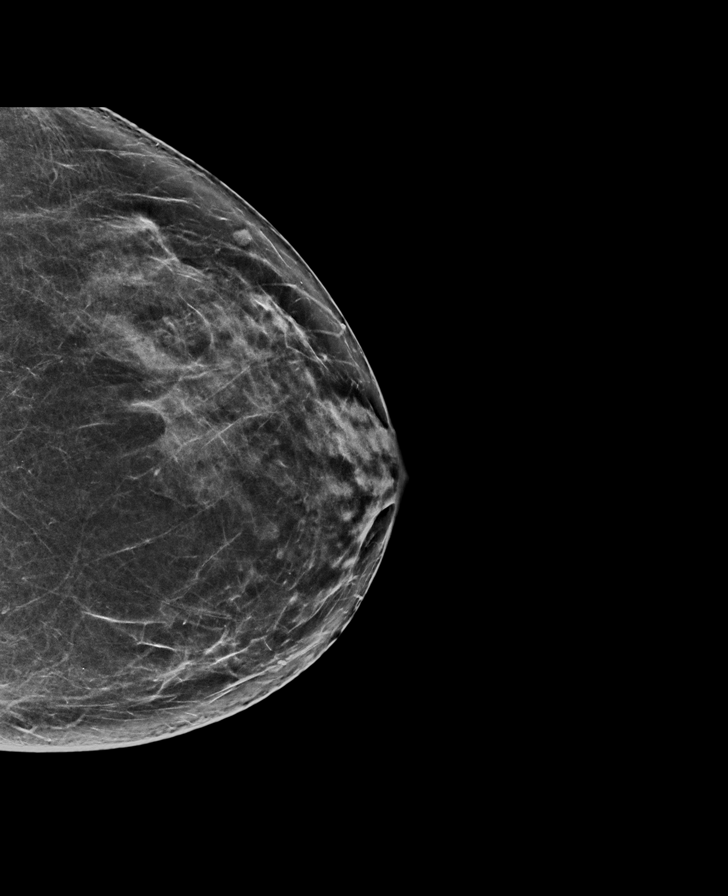

[R CC synth-2D]
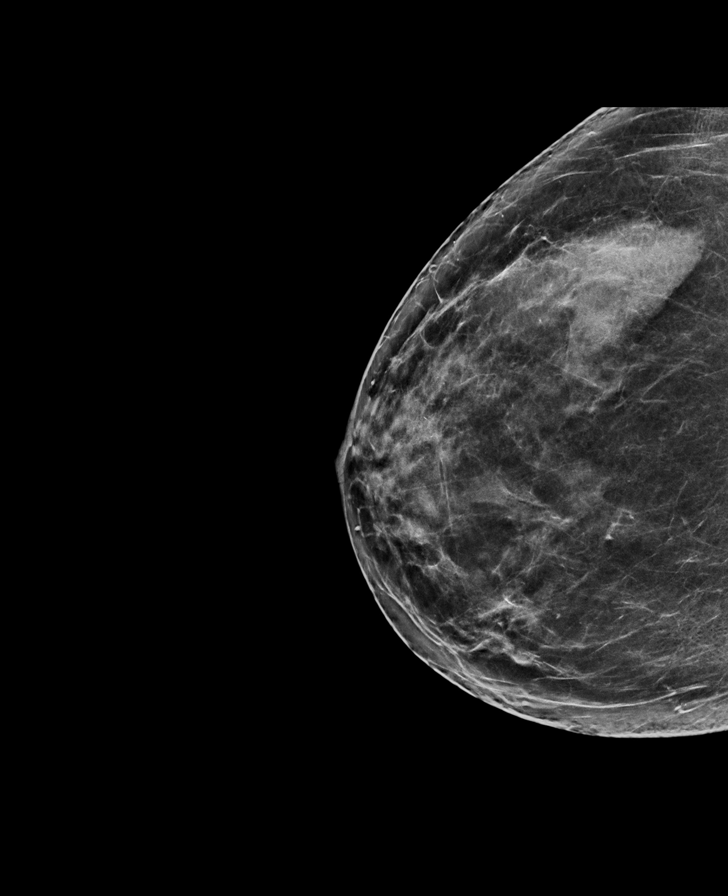

[R CC]
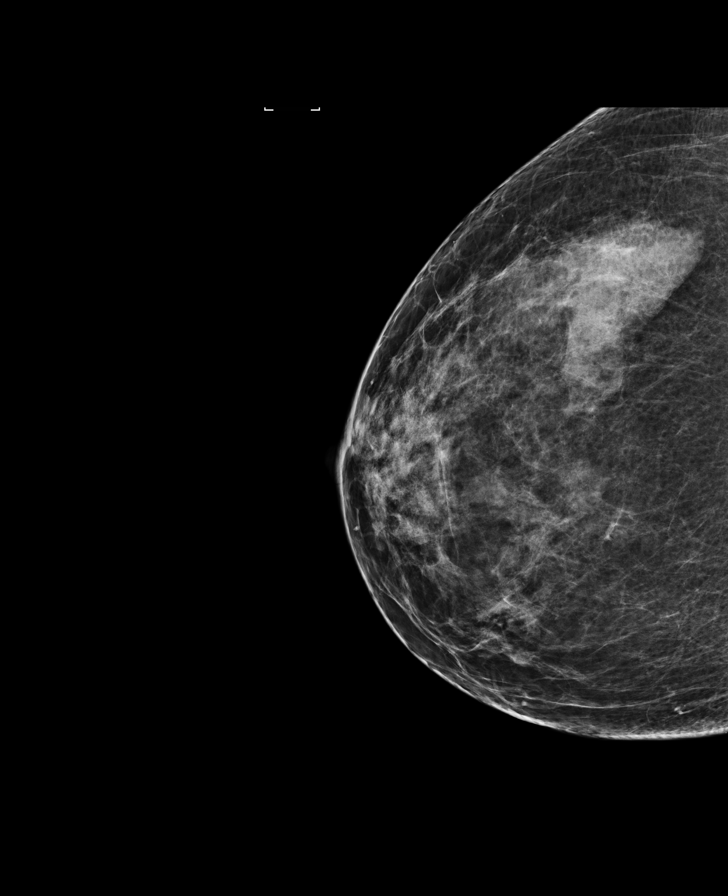

[L MLO]
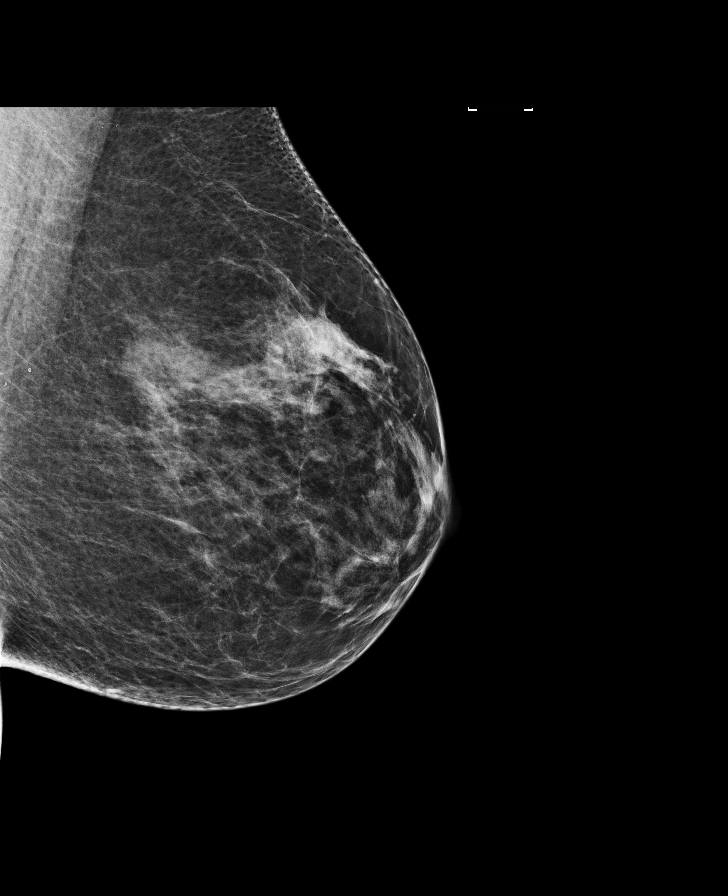

[L CC]
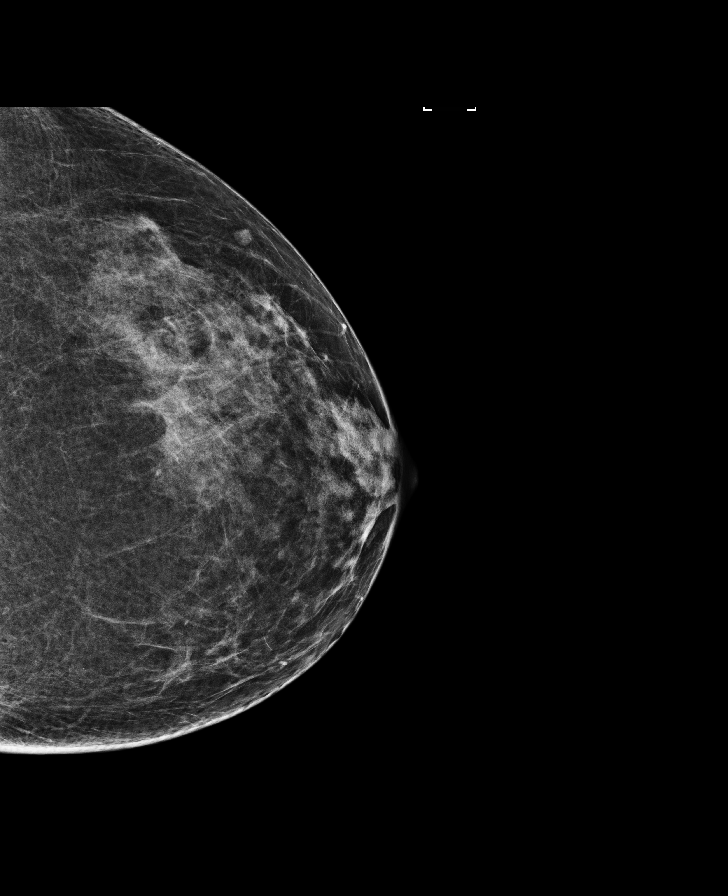

[8 of 28 positions shown; findings below may reference images not displayed]

ACR Breast Density Category b: There are scattered areas of
fibroglandular density.
FINDINGS: There are no findings suspicious for malignancy. Images were
processed with CAD.
IMPRESSION: No mammographic evidence of malignancy. A result letter of this
screening mammogram will be mailed directly to the patient.

RECOMMENDATION:
Screening mammogram in one year. (Code:97-6-RS4)

BI-RADS CATEGORY  1: Negative.

## 2017-06-06 DIAGNOSIS — Z6832 Body mass index (BMI) 32.0-32.9, adult: Secondary | ICD-10-CM | POA: Diagnosis not present

## 2017-06-06 DIAGNOSIS — Z01419 Encounter for gynecological examination (general) (routine) without abnormal findings: Secondary | ICD-10-CM | POA: Diagnosis not present

## 2017-06-08 DIAGNOSIS — E0500 Thyrotoxicosis with diffuse goiter without thyrotoxic crisis or storm: Secondary | ICD-10-CM | POA: Diagnosis not present

## 2017-06-12 DIAGNOSIS — Z23 Encounter for immunization: Secondary | ICD-10-CM | POA: Diagnosis not present

## 2017-06-12 DIAGNOSIS — E0500 Thyrotoxicosis with diffuse goiter without thyrotoxic crisis or storm: Secondary | ICD-10-CM | POA: Diagnosis not present

## 2017-06-26 DIAGNOSIS — H02531 Eyelid retraction right upper eyelid: Secondary | ICD-10-CM | POA: Diagnosis not present

## 2017-06-26 DIAGNOSIS — E0500 Thyrotoxicosis with diffuse goiter without thyrotoxic crisis or storm: Secondary | ICD-10-CM | POA: Diagnosis not present

## 2017-06-26 DIAGNOSIS — H02831 Dermatochalasis of right upper eyelid: Secondary | ICD-10-CM | POA: Diagnosis not present

## 2017-06-26 DIAGNOSIS — H02834 Dermatochalasis of left upper eyelid: Secondary | ICD-10-CM | POA: Diagnosis not present

## 2017-06-26 DIAGNOSIS — E039 Hypothyroidism, unspecified: Secondary | ICD-10-CM | POA: Diagnosis not present

## 2017-06-26 DIAGNOSIS — H02534 Eyelid retraction left upper eyelid: Secondary | ICD-10-CM | POA: Diagnosis not present

## 2017-06-26 DIAGNOSIS — Z87891 Personal history of nicotine dependence: Secondary | ICD-10-CM | POA: Diagnosis not present

## 2017-06-26 DIAGNOSIS — H02844 Edema of left upper eyelid: Secondary | ICD-10-CM | POA: Diagnosis not present

## 2017-06-26 DIAGNOSIS — H02539 Eyelid retraction unspecified eye, unspecified lid: Secondary | ICD-10-CM | POA: Diagnosis not present

## 2017-06-26 DIAGNOSIS — H02841 Edema of right upper eyelid: Secondary | ICD-10-CM | POA: Diagnosis not present

## 2017-06-26 DIAGNOSIS — H5021 Vertical strabismus, right eye: Secondary | ICD-10-CM | POA: Diagnosis not present

## 2017-06-26 DIAGNOSIS — H532 Diplopia: Secondary | ICD-10-CM | POA: Diagnosis not present

## 2017-07-11 DIAGNOSIS — E0500 Thyrotoxicosis with diffuse goiter without thyrotoxic crisis or storm: Secondary | ICD-10-CM | POA: Diagnosis not present

## 2017-07-11 DIAGNOSIS — H5021 Vertical strabismus, right eye: Secondary | ICD-10-CM | POA: Diagnosis not present

## 2017-07-11 DIAGNOSIS — H02531 Eyelid retraction right upper eyelid: Secondary | ICD-10-CM | POA: Diagnosis not present

## 2017-07-11 DIAGNOSIS — H532 Diplopia: Secondary | ICD-10-CM | POA: Diagnosis not present

## 2017-07-31 DIAGNOSIS — R9089 Other abnormal findings on diagnostic imaging of central nervous system: Secondary | ICD-10-CM | POA: Diagnosis not present

## 2017-07-31 DIAGNOSIS — E0500 Thyrotoxicosis with diffuse goiter without thyrotoxic crisis or storm: Secondary | ICD-10-CM | POA: Diagnosis not present

## 2017-08-01 DIAGNOSIS — H532 Diplopia: Secondary | ICD-10-CM | POA: Diagnosis not present

## 2017-08-01 DIAGNOSIS — H02539 Eyelid retraction unspecified eye, unspecified lid: Secondary | ICD-10-CM | POA: Diagnosis not present

## 2017-08-01 DIAGNOSIS — E0500 Thyrotoxicosis with diffuse goiter without thyrotoxic crisis or storm: Secondary | ICD-10-CM | POA: Diagnosis not present

## 2017-08-01 DIAGNOSIS — H5021 Vertical strabismus, right eye: Secondary | ICD-10-CM | POA: Diagnosis not present

## 2017-09-05 DIAGNOSIS — H5211 Myopia, right eye: Secondary | ICD-10-CM | POA: Diagnosis not present

## 2017-09-05 DIAGNOSIS — H532 Diplopia: Secondary | ICD-10-CM | POA: Diagnosis not present

## 2017-09-05 DIAGNOSIS — E0590 Thyrotoxicosis, unspecified without thyrotoxic crisis or storm: Secondary | ICD-10-CM | POA: Diagnosis not present

## 2017-09-05 DIAGNOSIS — H52223 Regular astigmatism, bilateral: Secondary | ICD-10-CM | POA: Diagnosis not present

## 2017-09-05 DIAGNOSIS — E0500 Thyrotoxicosis with diffuse goiter without thyrotoxic crisis or storm: Secondary | ICD-10-CM | POA: Diagnosis not present

## 2017-09-05 DIAGNOSIS — H5022 Vertical strabismus, left eye: Secondary | ICD-10-CM | POA: Diagnosis not present

## 2017-09-06 DIAGNOSIS — E0500 Thyrotoxicosis with diffuse goiter without thyrotoxic crisis or storm: Secondary | ICD-10-CM | POA: Diagnosis not present

## 2017-09-19 DIAGNOSIS — E0500 Thyrotoxicosis with diffuse goiter without thyrotoxic crisis or storm: Secondary | ICD-10-CM | POA: Diagnosis not present

## 2017-11-28 DIAGNOSIS — E0500 Thyrotoxicosis with diffuse goiter without thyrotoxic crisis or storm: Secondary | ICD-10-CM | POA: Diagnosis not present

## 2017-11-28 DIAGNOSIS — H5021 Vertical strabismus, right eye: Secondary | ICD-10-CM | POA: Diagnosis not present

## 2017-12-17 DIAGNOSIS — E0500 Thyrotoxicosis with diffuse goiter without thyrotoxic crisis or storm: Secondary | ICD-10-CM | POA: Diagnosis not present

## 2017-12-17 DIAGNOSIS — H5021 Vertical strabismus, right eye: Secondary | ICD-10-CM | POA: Diagnosis not present

## 2017-12-18 DIAGNOSIS — E0500 Thyrotoxicosis with diffuse goiter without thyrotoxic crisis or storm: Secondary | ICD-10-CM | POA: Diagnosis not present

## 2017-12-19 DIAGNOSIS — E0590 Thyrotoxicosis, unspecified without thyrotoxic crisis or storm: Secondary | ICD-10-CM | POA: Diagnosis not present

## 2017-12-19 DIAGNOSIS — H5021 Vertical strabismus, right eye: Secondary | ICD-10-CM | POA: Diagnosis not present

## 2018-03-20 DIAGNOSIS — E0500 Thyrotoxicosis with diffuse goiter without thyrotoxic crisis or storm: Secondary | ICD-10-CM | POA: Diagnosis not present

## 2018-03-21 DIAGNOSIS — E0500 Thyrotoxicosis with diffuse goiter without thyrotoxic crisis or storm: Secondary | ICD-10-CM | POA: Diagnosis not present

## 2018-04-23 DIAGNOSIS — H02413 Mechanical ptosis of bilateral eyelids: Secondary | ICD-10-CM | POA: Diagnosis not present

## 2018-04-24 ENCOUNTER — Other Ambulatory Visit: Payer: Self-pay | Admitting: Oculoplastics Ophthalmology

## 2018-04-24 DIAGNOSIS — H02413 Mechanical ptosis of bilateral eyelids: Secondary | ICD-10-CM | POA: Diagnosis not present

## 2018-04-24 DIAGNOSIS — Z01818 Encounter for other preprocedural examination: Secondary | ICD-10-CM | POA: Diagnosis not present

## 2018-04-26 ENCOUNTER — Other Ambulatory Visit: Payer: Self-pay | Admitting: Oculoplastics Ophthalmology

## 2018-04-26 DIAGNOSIS — E0500 Thyrotoxicosis with diffuse goiter without thyrotoxic crisis or storm: Secondary | ICD-10-CM

## 2018-05-08 ENCOUNTER — Ambulatory Visit
Admission: RE | Admit: 2018-05-08 | Discharge: 2018-05-08 | Disposition: A | Discharge status: Home / Self Care | Payer: BLUE CROSS/BLUE SHIELD | Source: Ambulatory Visit | Attending: Oculoplastics Ophthalmology | Admitting: Oculoplastics Ophthalmology

## 2018-05-08 DIAGNOSIS — E0500 Thyrotoxicosis with diffuse goiter without thyrotoxic crisis or storm: Secondary | ICD-10-CM | POA: Diagnosis not present

## 2018-05-20 DIAGNOSIS — H02412 Mechanical ptosis of left eyelid: Secondary | ICD-10-CM | POA: Diagnosis not present

## 2018-05-20 DIAGNOSIS — H02403 Unspecified ptosis of bilateral eyelids: Secondary | ICD-10-CM | POA: Diagnosis not present

## 2018-05-20 DIAGNOSIS — H02413 Mechanical ptosis of bilateral eyelids: Secondary | ICD-10-CM | POA: Diagnosis not present

## 2018-05-20 DIAGNOSIS — H02411 Mechanical ptosis of right eyelid: Secondary | ICD-10-CM | POA: Diagnosis not present

## 2018-06-20 DIAGNOSIS — Z0000 Encounter for general adult medical examination without abnormal findings: Secondary | ICD-10-CM | POA: Diagnosis not present

## 2018-06-20 DIAGNOSIS — E7800 Pure hypercholesterolemia, unspecified: Secondary | ICD-10-CM | POA: Diagnosis not present

## 2018-06-20 DIAGNOSIS — E0500 Thyrotoxicosis with diffuse goiter without thyrotoxic crisis or storm: Secondary | ICD-10-CM | POA: Diagnosis not present

## 2018-06-24 DIAGNOSIS — E0500 Thyrotoxicosis with diffuse goiter without thyrotoxic crisis or storm: Secondary | ICD-10-CM | POA: Diagnosis not present

## 2018-06-25 DIAGNOSIS — Z01419 Encounter for gynecological examination (general) (routine) without abnormal findings: Secondary | ICD-10-CM | POA: Diagnosis not present

## 2018-06-25 DIAGNOSIS — Z13820 Encounter for screening for osteoporosis: Secondary | ICD-10-CM | POA: Diagnosis not present

## 2018-06-25 DIAGNOSIS — Z6833 Body mass index (BMI) 33.0-33.9, adult: Secondary | ICD-10-CM | POA: Diagnosis not present

## 2018-06-25 DIAGNOSIS — Z1231 Encounter for screening mammogram for malignant neoplasm of breast: Secondary | ICD-10-CM | POA: Diagnosis not present

## 2018-06-27 DIAGNOSIS — E0500 Thyrotoxicosis with diffuse goiter without thyrotoxic crisis or storm: Secondary | ICD-10-CM | POA: Diagnosis not present

## 2018-06-27 DIAGNOSIS — Z23 Encounter for immunization: Secondary | ICD-10-CM | POA: Diagnosis not present

## 2018-08-21 DIAGNOSIS — H5022 Vertical strabismus, left eye: Secondary | ICD-10-CM | POA: Diagnosis not present

## 2018-09-11 DIAGNOSIS — E0500 Thyrotoxicosis with diffuse goiter without thyrotoxic crisis or storm: Secondary | ICD-10-CM | POA: Diagnosis not present

## 2018-09-13 DIAGNOSIS — E0500 Thyrotoxicosis with diffuse goiter without thyrotoxic crisis or storm: Secondary | ICD-10-CM | POA: Diagnosis not present

## 2018-11-28 DIAGNOSIS — H2513 Age-related nuclear cataract, bilateral: Secondary | ICD-10-CM | POA: Diagnosis not present

## 2018-11-28 DIAGNOSIS — H353132 Nonexudative age-related macular degeneration, bilateral, intermediate dry stage: Secondary | ICD-10-CM | POA: Diagnosis not present

## 2018-11-28 DIAGNOSIS — H5000 Unspecified esotropia: Secondary | ICD-10-CM | POA: Diagnosis not present

## 2018-11-28 DIAGNOSIS — H5203 Hypermetropia, bilateral: Secondary | ICD-10-CM | POA: Diagnosis not present

## 2018-12-13 DIAGNOSIS — E0500 Thyrotoxicosis with diffuse goiter without thyrotoxic crisis or storm: Secondary | ICD-10-CM | POA: Diagnosis not present

## 2018-12-16 DIAGNOSIS — E0500 Thyrotoxicosis with diffuse goiter without thyrotoxic crisis or storm: Secondary | ICD-10-CM | POA: Diagnosis not present

## 2019-03-11 DIAGNOSIS — E0500 Thyrotoxicosis with diffuse goiter without thyrotoxic crisis or storm: Secondary | ICD-10-CM | POA: Diagnosis not present

## 2019-08-06 ENCOUNTER — Other Ambulatory Visit: Payer: Self-pay | Admitting: Physician Assistant

## 2019-08-06 ENCOUNTER — Other Ambulatory Visit (HOSPITAL_COMMUNITY): Payer: Self-pay | Admitting: Physician Assistant

## 2019-08-06 DIAGNOSIS — Z96653 Presence of artificial knee joint, bilateral: Secondary | ICD-10-CM

## 2019-08-20 ENCOUNTER — Encounter (HOSPITAL_COMMUNITY): Payer: Medicare Other

## 2019-08-20 ENCOUNTER — Encounter (HOSPITAL_COMMUNITY): Payer: Self-pay

## 2019-08-20 ENCOUNTER — Encounter (HOSPITAL_COMMUNITY): Payer: BLUE CROSS/BLUE SHIELD

## 2019-09-09 ENCOUNTER — Other Ambulatory Visit: Payer: Self-pay | Admitting: Ophthalmology

## 2019-09-09 DIAGNOSIS — E0500 Thyrotoxicosis with diffuse goiter without thyrotoxic crisis or storm: Secondary | ICD-10-CM

## 2019-09-09 DIAGNOSIS — H5789 Other specified disorders of eye and adnexa: Secondary | ICD-10-CM

## 2019-09-18 ENCOUNTER — Ambulatory Visit
Admission: RE | Admit: 2019-09-18 | Discharge: 2019-09-18 | Disposition: A | Discharge status: Home / Self Care | Payer: Medicare Other | Source: Ambulatory Visit | Attending: Ophthalmology | Admitting: Ophthalmology

## 2019-09-18 DIAGNOSIS — E0500 Thyrotoxicosis with diffuse goiter without thyrotoxic crisis or storm: Secondary | ICD-10-CM

## 2019-09-18 DIAGNOSIS — H5789 Other specified disorders of eye and adnexa: Secondary | ICD-10-CM

## 2020-02-27 IMAGING — CT CT ORBITS W/O CM
3 series · 14 of 47 positions shown, 16 images · non-contrast
Comparison: 05/08/2018

CLINICAL DATA: Double vision.  History of thyroid disease

EXAM:
CT ORBITS WITHOUT CONTRAST
TECHNIQUE: Multidetector CT images were obtained using the standard protocol
without intravenous contrast.

[Series 3: orbits 2.00 hr36 s3 axial soft · axial · 0.34mm/px · z∈[-596,-504]mm · 8 of 54 slices shown, 10 images]
[im 4/54  brain]
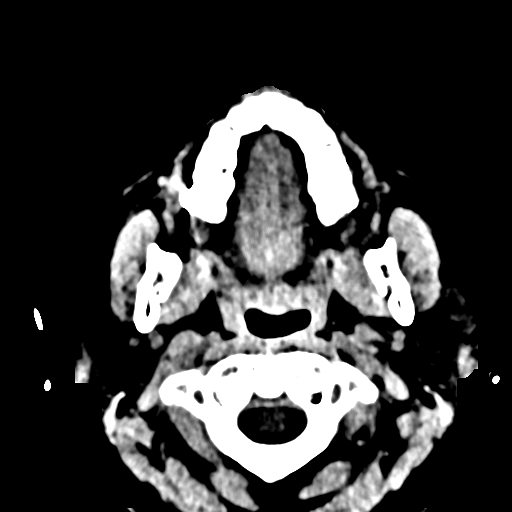
[im 4/54  bone]
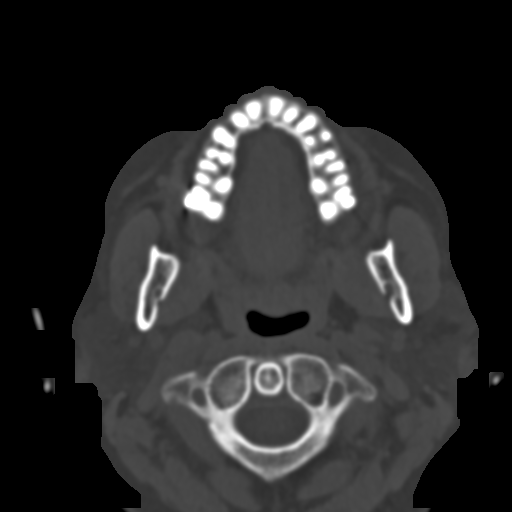
[im 11/54  bone]
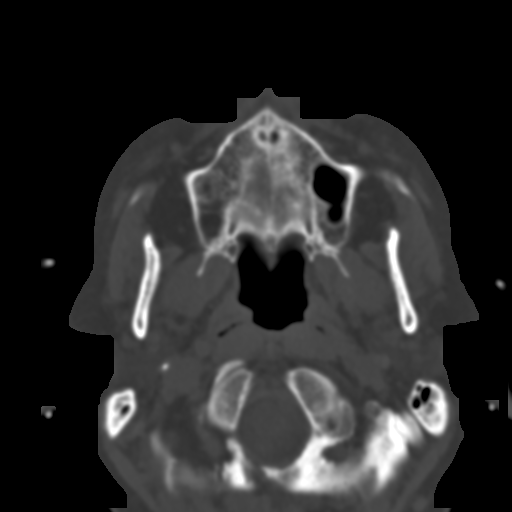
[im 17/54  bone]
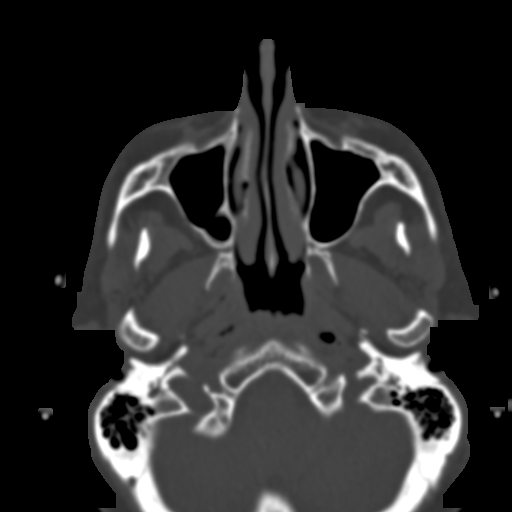
[im 24/54  bone]
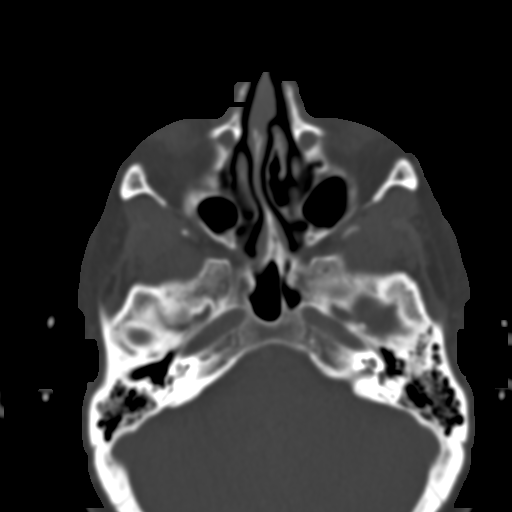
[im 30/54  brain]
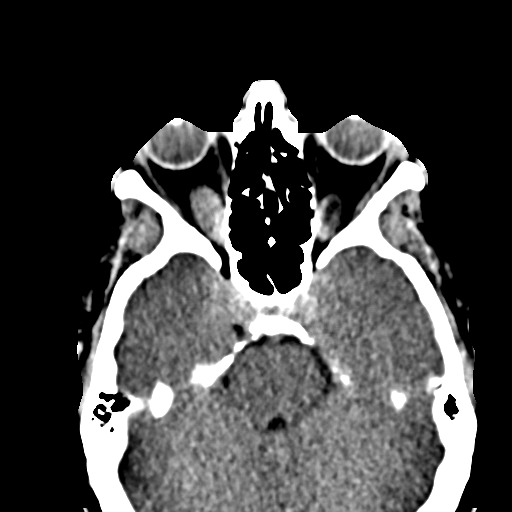
[im 30/54  bone]
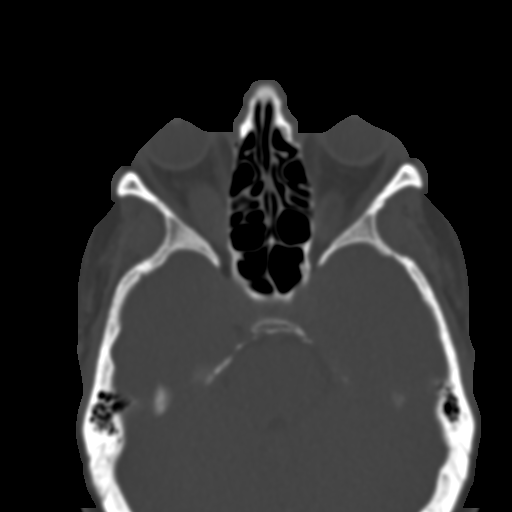
[im 37/54  bone]
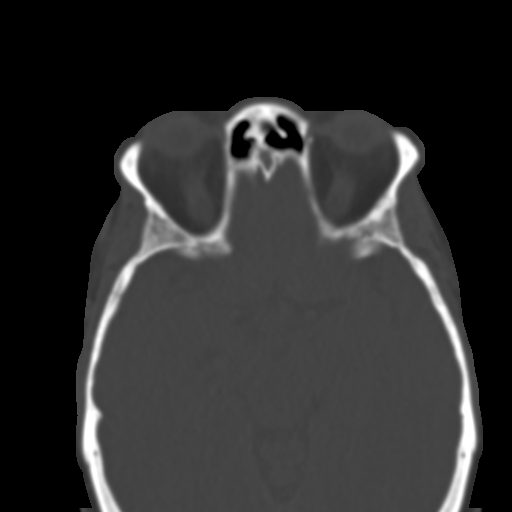
[im 43/54  bone]
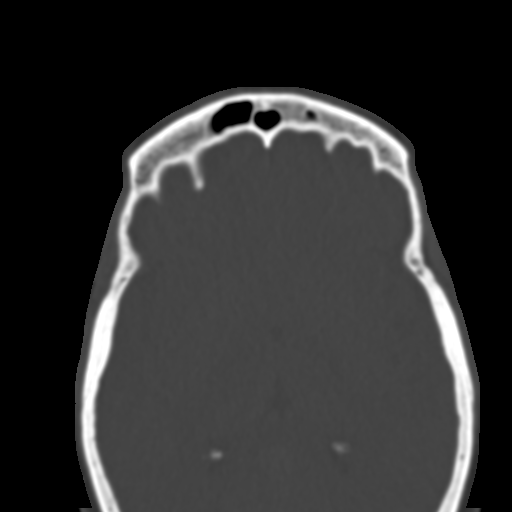
[im 50/54  bone]
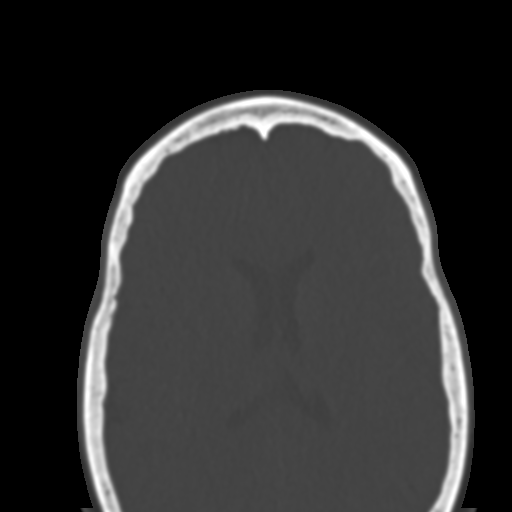

[Series 6: orbits 2.00 hr36 s3 cor soft · coronal · 0.21mm/px · 3 of 87 slices shown]
[im 29/87  bone]
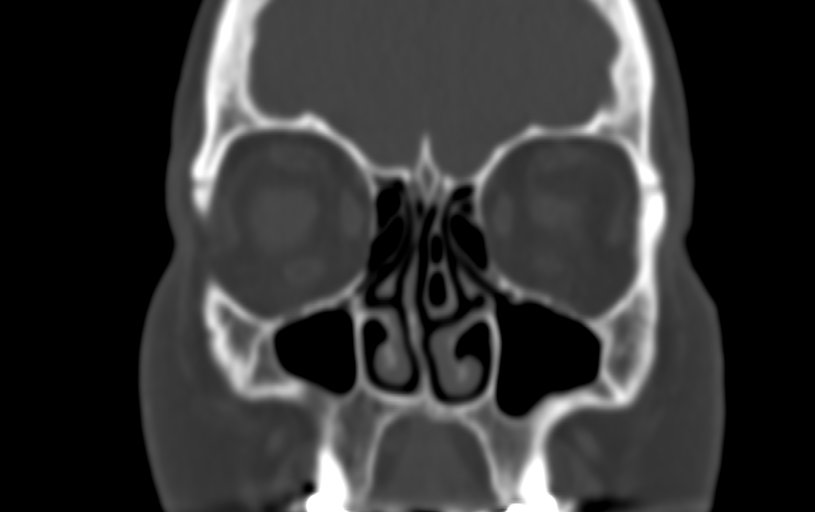
[im 39/87  bone]
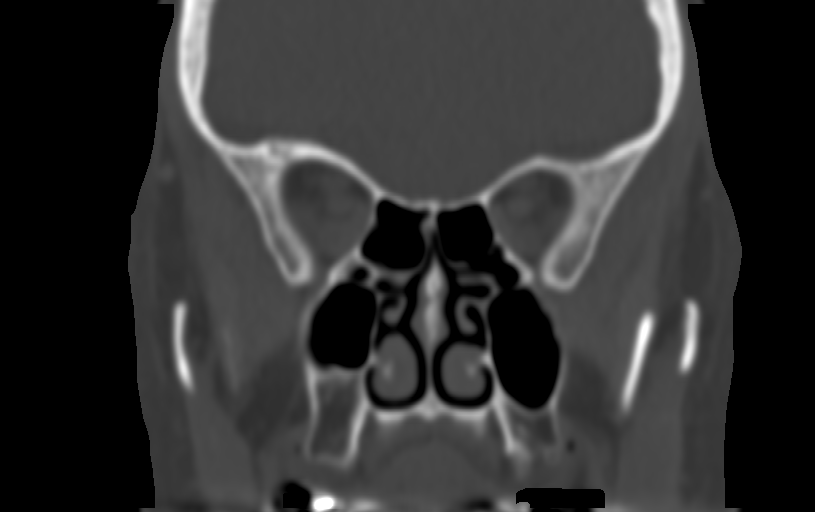
[im 48/87  bone]
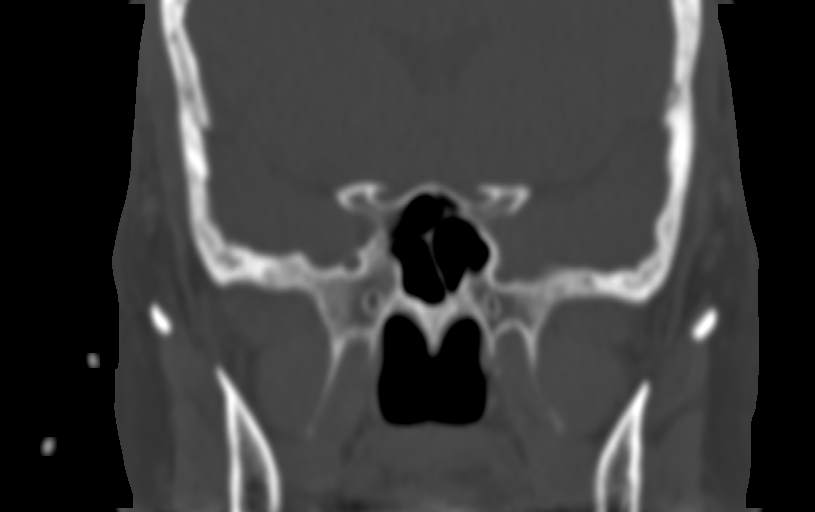

[Series 10: orbits 2.00 hr36 s3 sag soft · sagittal · 0.21mm/px · 3 of 87 slices shown]
[im 29/87  bone]
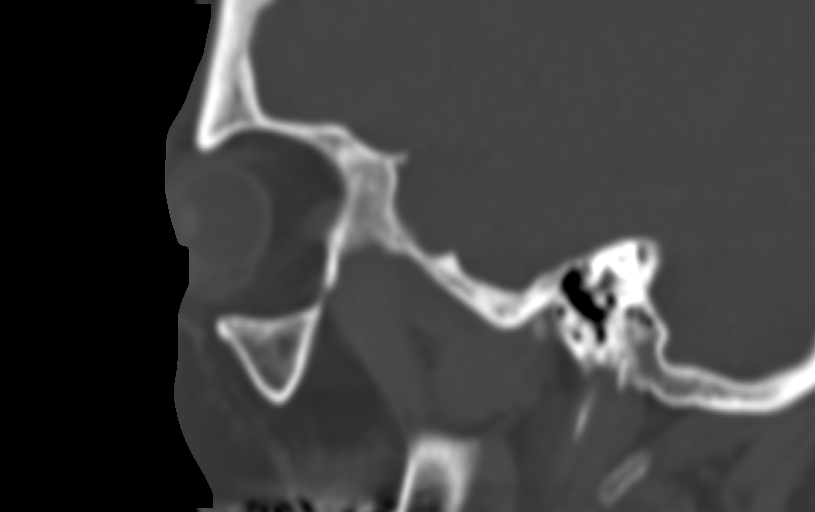
[im 44/87  bone]
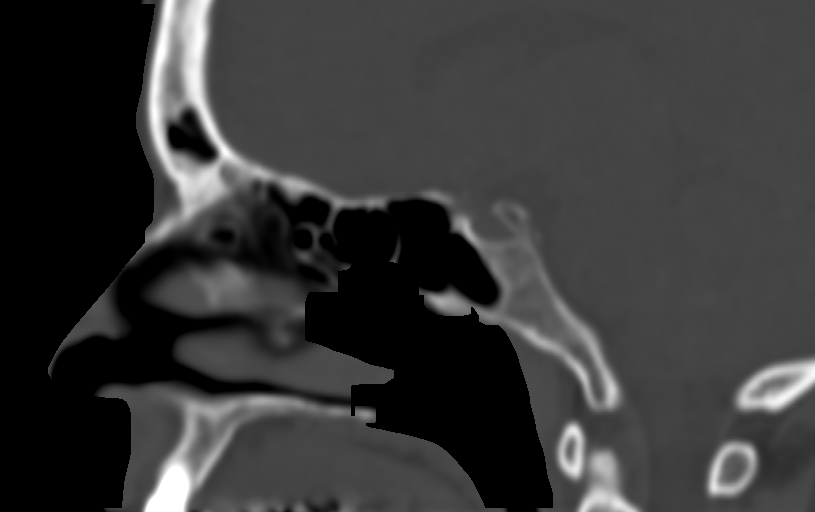
[im 58/87  bone]
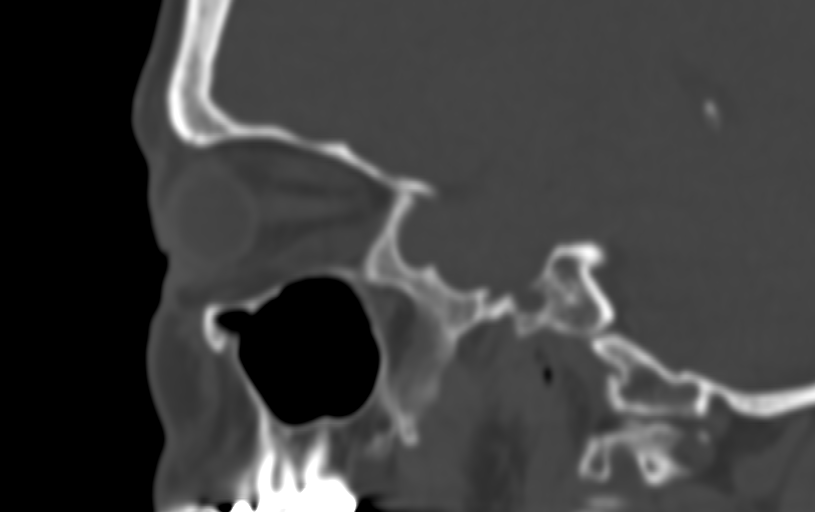

[14 of 47 positions shown; findings below may reference images not displayed]

FINDINGS: Orbits: Typical thyroid orbitopathy with extraocular muscle
thickening involving all of the rectus muscles on both sides,
inferior rectus most affected and measuring up to 11 mm short axis
on the right. The right inferior rectus thickness has progressed
from prior. The left inferior rectus and bilateral superior rectus
has decreased thickening compared to prior, now with a more fatty
infiltrative appearance. No fat inflammation is seen. No
displacement of the optic nerve sheath complexes or asymmetric,
progressive orbital apex crowding. The globes themselves are
unremarkable. Proptosis is unchanged when remeasured in a similar
fashion.

Visualized sinuses: Clear

Soft tissues: Negative

Limited intracranial: Negative
IMPRESSION: Thyroid orbitopathy with evolving extraocular muscle thickening,
increased at the right inferior rectus and decreased at the left
inferior rectus and bilateral superior rectus. No increased
proptosis or nerve sheath displacement. Crowding at the orbital apex
is unchanged and mild.

## 2020-07-14 ENCOUNTER — Other Ambulatory Visit: Payer: Self-pay | Admitting: Obstetrics and Gynecology

## 2020-07-14 DIAGNOSIS — R928 Other abnormal and inconclusive findings on diagnostic imaging of breast: Secondary | ICD-10-CM

## 2020-07-23 ENCOUNTER — Other Ambulatory Visit: Payer: Self-pay

## 2020-07-23 ENCOUNTER — Ambulatory Visit
Admission: RE | Admit: 2020-07-23 | Discharge: 2020-07-23 | Disposition: A | Discharge status: Home / Self Care | Payer: Medicare Other | Source: Ambulatory Visit | Attending: Obstetrics and Gynecology | Admitting: Obstetrics and Gynecology

## 2020-07-23 DIAGNOSIS — R928 Other abnormal and inconclusive findings on diagnostic imaging of breast: Secondary | ICD-10-CM

## 2023-09-18 ENCOUNTER — Other Ambulatory Visit: Payer: Self-pay | Admitting: Obstetrics and Gynecology

## 2023-09-18 DIAGNOSIS — R928 Other abnormal and inconclusive findings on diagnostic imaging of breast: Secondary | ICD-10-CM

## 2023-10-04 ENCOUNTER — Other Ambulatory Visit: Payer: Medicare Other

## 2023-10-08 ENCOUNTER — Ambulatory Visit
Admission: RE | Admit: 2023-10-08 | Discharge: 2023-10-08 | Disposition: A | Discharge status: Home / Self Care | Payer: Medicare Other | Source: Ambulatory Visit | Attending: Obstetrics and Gynecology | Admitting: Obstetrics and Gynecology

## 2023-10-08 ENCOUNTER — Other Ambulatory Visit: Payer: Medicare Other

## 2023-10-08 DIAGNOSIS — R928 Other abnormal and inconclusive findings on diagnostic imaging of breast: Secondary | ICD-10-CM
